# Patient Record
Sex: Male | Born: 1980 | Race: Black or African American | Hispanic: No | Marital: Single | Smoking: Current every day smoker
Health system: Southern US, Community
[De-identification: ages and names within clinical notes are randomized; demographics above are authoritative.]

## PROBLEM LIST (undated history)

## (undated) DIAGNOSIS — E109 Type 1 diabetes mellitus without complications: Secondary | ICD-10-CM

## (undated) DIAGNOSIS — E119 Type 2 diabetes mellitus without complications: Secondary | ICD-10-CM

## (undated) HISTORY — DX: Type 1 diabetes mellitus without complications: E10.9

---

## 2006-01-09 ENCOUNTER — Emergency Department (HOSPITAL_COMMUNITY): Admission: EM | Admit: 2006-01-09 | Discharge: 2006-01-09 | Payer: Self-pay | Admitting: *Deleted

## 2008-07-01 DIAGNOSIS — E109 Type 1 diabetes mellitus without complications: Secondary | ICD-10-CM

## 2008-07-01 HISTORY — DX: Type 1 diabetes mellitus without complications: E10.9

## 2011-04-20 ENCOUNTER — Emergency Department (HOSPITAL_COMMUNITY)
Admission: EM | Admit: 2011-04-20 | Discharge: 2011-04-20 | Discharge status: Against Medical Advice | Payer: Self-pay | Attending: Emergency Medicine | Admitting: Emergency Medicine

## 2011-04-20 DIAGNOSIS — Z0389 Encounter for observation for other suspected diseases and conditions ruled out: Secondary | ICD-10-CM | POA: Insufficient documentation

## 2011-04-21 ENCOUNTER — Inpatient Hospital Stay (HOSPITAL_COMMUNITY)
Admission: EM | Admit: 2011-04-21 | Discharge: 2011-04-23 | DRG: 639 | Disposition: A | Discharge status: Home / Self Care | Payer: Self-pay | Attending: Internal Medicine | Admitting: Internal Medicine

## 2011-04-21 DIAGNOSIS — Z833 Family history of diabetes mellitus: Secondary | ICD-10-CM

## 2011-04-21 DIAGNOSIS — E1010 Type 1 diabetes mellitus with ketoacidosis without coma: Principal | ICD-10-CM | POA: Diagnosis present

## 2011-04-21 DIAGNOSIS — Z794 Long term (current) use of insulin: Secondary | ICD-10-CM

## 2011-04-21 DIAGNOSIS — F17200 Nicotine dependence, unspecified, uncomplicated: Secondary | ICD-10-CM | POA: Diagnosis present

## 2011-04-21 LAB — CBC
HCT: 48.2 % (ref 39.0–52.0)
Hemoglobin: 16.5 g/dL (ref 13.0–17.0)
MCH: 27.1 pg (ref 26.0–34.0)
MCHC: 34.2 g/dL (ref 30.0–36.0)
MCV: 79.1 fL (ref 78.0–100.0)
RBC: 6.09 MIL/uL — ABNORMAL HIGH (ref 4.22–5.81)

## 2011-04-21 LAB — BASIC METABOLIC PANEL
BUN: 27 mg/dL — ABNORMAL HIGH (ref 6–23)
CO2: 7 mEq/L — CL (ref 19–32)
Chloride: 93 mEq/L — ABNORMAL LOW (ref 96–112)
Creatinine, Ser: 1.59 mg/dL — ABNORMAL HIGH (ref 0.50–1.35)

## 2011-04-21 LAB — DIFFERENTIAL
Basophils Absolute: 0 10*3/uL (ref 0.0–0.1)
Lymphs Abs: 1.5 10*3/uL (ref 0.7–4.0)
Monocytes Relative: 4 % (ref 3–12)
Neutro Abs: 16.4 10*3/uL — ABNORMAL HIGH (ref 1.7–7.7)
Neutrophils Relative %: 88 % — ABNORMAL HIGH (ref 43–77)

## 2011-04-21 LAB — POCT I-STAT, CHEM 8
Calcium, Ion: 1.14 mmol/L (ref 1.12–1.32)
Creatinine, Ser: 1.9 mg/dL — ABNORMAL HIGH (ref 0.50–1.35)
Glucose, Bld: 562 mg/dL (ref 70–99)
Hemoglobin: 19.4 g/dL — ABNORMAL HIGH (ref 13.0–17.0)
Potassium: 6.4 mEq/L (ref 3.5–5.1)
Sodium: 132 mEq/L — ABNORMAL LOW (ref 135–145)
TCO2: 10 mmol/L (ref 0–100)

## 2011-04-21 LAB — POCT I-STAT 3, VENOUS BLOOD GAS (G3P V)
Acid-base deficit: 21 mmol/L — ABNORMAL HIGH (ref 0.0–2.0)
Patient temperature: 98.6
pCO2, Ven: 34.5 mmHg — ABNORMAL LOW (ref 45.0–50.0)
pO2, Ven: 30 mmHg (ref 30.0–45.0)

## 2011-04-21 LAB — URINALYSIS, ROUTINE W REFLEX MICROSCOPIC
Protein, ur: 30 mg/dL — AB
pH: 5 (ref 5.0–8.0)

## 2011-04-21 LAB — GLUCOSE, CAPILLARY
Glucose-Capillary: 488 mg/dL — ABNORMAL HIGH (ref 70–99)
Glucose-Capillary: 377 mg/dL — ABNORMAL HIGH (ref 70–99)

## 2011-04-21 LAB — POTASSIUM: Potassium: 6.3 mEq/L (ref 3.5–5.1)

## 2011-04-21 LAB — URINE MICROSCOPIC-ADD ON

## 2011-04-22 LAB — GLUCOSE, CAPILLARY
Glucose-Capillary: 297 mg/dL — ABNORMAL HIGH (ref 70–99)
Glucose-Capillary: 321 mg/dL — ABNORMAL HIGH (ref 70–99)
Glucose-Capillary: 185 mg/dL — ABNORMAL HIGH (ref 70–99)
Glucose-Capillary: 230 mg/dL — ABNORMAL HIGH (ref 70–99)
Glucose-Capillary: 121 mg/dL — ABNORMAL HIGH (ref 70–99)
Glucose-Capillary: 132 mg/dL — ABNORMAL HIGH (ref 70–99)
Glucose-Capillary: 526 mg/dL — ABNORMAL HIGH (ref 70–99)
Glucose-Capillary: 320 mg/dL — ABNORMAL HIGH (ref 70–99)

## 2011-04-22 LAB — CBC
HCT: 38.3 % — ABNORMAL LOW (ref 39.0–52.0)
Hemoglobin: 13.1 g/dL (ref 13.0–17.0)
MCH: 25.9 pg — ABNORMAL LOW (ref 26.0–34.0)
MCHC: 34.2 g/dL (ref 30.0–36.0)
MCV: 75.8 fL — ABNORMAL LOW (ref 78.0–100.0)
Platelets: 350 10*3/uL (ref 150–400)
RBC: 5.05 MIL/uL (ref 4.22–5.81)
RDW: 14.8 % (ref 11.5–15.5)
WBC: 15 10*3/uL — ABNORMAL HIGH (ref 4.0–10.5)

## 2011-04-22 LAB — MAGNESIUM: Magnesium: 2.3 mg/dL (ref 1.5–2.5)

## 2011-04-22 LAB — BASIC METABOLIC PANEL
BUN: 20 mg/dL (ref 6–23)
BUN: 16 mg/dL (ref 6–23)
CO2: 19 mEq/L (ref 19–32)
Calcium: 8.1 mg/dL — ABNORMAL LOW (ref 8.4–10.5)
Calcium: 8.2 mg/dL — ABNORMAL LOW (ref 8.4–10.5)
Chloride: 110 mEq/L (ref 96–112)
Creatinine, Ser: 1.33 mg/dL (ref 0.50–1.35)
Creatinine, Ser: 1.24 mg/dL (ref 0.50–1.35)
GFR calc Af Amer: 82 mL/min — ABNORMAL LOW (ref >90)
GFR calc Af Amer: 89 mL/min — ABNORMAL LOW (ref >90)
GFR calc non Af Amer: 77 mL/min — ABNORMAL LOW (ref >90)
Glucose, Bld: 126 mg/dL — ABNORMAL HIGH (ref 70–99)
Potassium: 3.9 mEq/L (ref 3.5–5.1)
Sodium: 138 mEq/L (ref 135–145)

## 2011-04-22 LAB — PHOSPHORUS: Phosphorus: 2.1 mg/dL — ABNORMAL LOW (ref 2.3–4.6)

## 2011-04-22 LAB — URINE CULTURE: Colony Count: 5000

## 2011-04-23 LAB — GLUCOSE, CAPILLARY: Glucose-Capillary: 257 mg/dL — ABNORMAL HIGH (ref 70–99)

## 2011-04-23 LAB — BASIC METABOLIC PANEL
BUN: 13 mg/dL (ref 6–23)
Creatinine, Ser: 1.11 mg/dL (ref 0.50–1.35)
GFR calc Af Amer: >90 mL/min (ref >90)
GFR calc non Af Amer: 88 mL/min — ABNORMAL LOW (ref >90)

## 2011-04-23 NOTE — H&P (Signed)
Matthew [name], Matthew [name] NO.:  [id number]  MEDICAL RECORD NO.:  [id number]  LOCATION:  3312                         FACILITY:  [facility]  PHYSICIAN:  Matthew [name], MDDATE OF BIRTH:  [birth year]  DATE OF ADMISSION:  04/21/2011 DATE OF DISCHARGE:                             HISTORY & PHYSICAL   PRIMARY CARE PHYSICIAN:  Dr. Darius [name] in [place], but the patient is moving to [place].  CHIEF COMPLAINT:  Feels bad allover.  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  He did have a little nausea, but only vomited one time.  He has had DKA before, the last episode was about 8 months ago, so he recognizes the symptoms.  The patient said his last dose of insulin was slightly greater than 48 hours ago.  Yesterday, his sugars were quite high and today he said he started feeling really horrible, so he came and for treatment.  PAST MEDICAL HISTORY:  Significant for diabetes mellitus type 1 which was diagnosed in 2009.  The patient was around 30 years old at that time.  SOCIAL HISTORY:  He does smoke cigarettes.  Occasional marijuana use. Occasional drinker.  FAMILY HISTORY:  He has an aunt who has type 1 diabetes.  The patient has no history of any surgeries.  MEDICATION LIST PRIOR TO ARRIVAL:  70/30 insulin 30 units every morning and sliding scale insulin that he would take with meals 3 times a day.  REVIEW OF SYSTEMS:  The patient was in his usual state of health while he was on his insulin a couple of days ago without any trouble with lower extremity edema, headache, chest pain, shortness of breath.  No fevers, no chills, no diarrhea, no constipation, no abdominal pain at that time.  All other systems are reviewed and are negative prior to him feeling sick in the last 24 hours.  PHYSICAL EXAMINATION:  VITAL SIGNS:  In the emergency  department, initially blood pressure was 144/88.  Initially, they were unable to get a temperature.  Pulse 118, respiratory rate 22, O2 sats 100%. GENERAL:  The patient is a well-nourished, well-developed young man in no acute distress. HEENT:  Normocephalic and atraumatic.  Pupils are equal and round. Sclerae are nonicteric.  Oral mucosa is moist. NECK:  Supple.  No lymphadenopathy.  No thyromegaly.  No jugular venous distention.  The patient has been able to drink some liquids. CARDIAC:  Tachycardic, but regular. LUNGS:  Clear to auscultation bilaterally.  No wheezes, rhonchi, or rales. ABDOMEN:  He has some slight guarding, but denies tenderness.  No rebound, or guarding, no hepatosplenomegaly.  He does have bowel sounds, though they are infrequent. EXTREMITIES:  No evidence of clubbing, cyanosis, or pitting edema.  He does have palpable DP pulses bilaterally. SKIN:  Warm, dry, and intact.  No open lesions or rashes.  He does have tattoos. NEUROLOGICAL:  He is alert and oriented x3.  His cranial nerves II-XII are intact grossly.  He has a normal affect.  He has 5/5 strength in his upper and lower extremities.  Sensory examination is deferred because of acidosis. MUSCULOSKELETAL:  No evidence of effusion of effusions of his joints. Fairly good range of motion.  DATA:  The patient's i-STAT revealed a hemoglobin of 19, hematocrit of 57, sodium is 132, potassium 6.4, chloride 105, glucose 562, BUN 33, creatinine 1.9.  He had an ABG which revealed pH of 7.03, it was venous. His other blood work is pending.  He did have a urinalysis which revealed clear urine greater than 1000 glucose, ketones, negative nitrite, negative leukocyte esterase, rare squamous epithelial cells. His had no radiology studies.  ASSESSMENT AND PLAN:  A 30 year old gentleman in diabetic ketoacidosis at least moderate in nature.  The plan is to put him on IV fluids.  He is already on an IV insulin drip and has  already started to receive boluses of normal saline.  We can correct his acidosis and resolve the DKA, but the patient's longstanding problem is his inability to afford medical care.  He ran out of insulin because he was not able to buy it and had not been able to get to the doctor to get a new prescription, so we will ask case management to see him.  He is planning to move to [facility] from [place] perhaps away of the find him a primary care physician, but even without that my recommendation for now is to put him on the least expensive insulin which I believe is NPH.  We will have case management verify this, though it is inconvenient he should be able to afford it and will at least have access to insulin at all time, so we will start him on 10 units of NPH twice daily once he is off the insulin drip.  He previously was on 30 units of 70/30 insulin once daily and then had a sliding scale that he would take with his meals, so we will start with the NPH as I believe it is a only couple dollars a month and await further recommendations or better recommendations from case management.     Matthew Bottoms, MD     CVC/MEDQ  D:  04/21/2011  T:  04/22/2011  Job:  [id number]  cc:   Dr. Darius [name]  Electronically Signed by Matthew Irish MD on 04/23/2011 12:16:24 PM

## 2011-04-29 NOTE — Discharge Summary (Signed)
  NAMEHUMBERTO, [name] NO.:  [id number]  MEDICAL RECORD NO.:  [id number]  LOCATION:  5019                         FACILITY:  [facility]  PHYSICIAN:  Lonia Blood, M.D.       DATE OF BIRTH:  1981/04/23  DATE OF ADMISSION:  04/22/2011 DATE OF DISCHARGE:  04/23/2011                              DISCHARGE SUMMARY   PRIMARY CARE PHYSICIAN:  This patient has been referred to the [facility] for ongoing diabetes management.  DISCHARGE DIAGNOSES: 1. Diabetic ketoacidosis due to inability to afford the insulin. 2. Diabetes mellitus type 1. 3. Hypokalemia. 4. Tobacco abuse.  DISCHARGE MEDICATIONS: 1. Insulin NovoLog FlexPen 8 units with each meal. 2. Lantus 25 units daily.  CONDITION AT DISCHARGE:  Matthew [name] was discharged in good condition. Able to tolerate the regular diet without nausea or vomiting.  He was scheduled for [facility] on May 02, 2011.  PROCEDURES DURING THIS ADMISSION:  No procedures done.  CONSULTATION:  No consultations obtained.  HISTORY AND PHYSICAL:  Refer to the dictated H and P which was done by Dr. Gasper Sells.  HOSPITAL COURSE:  Matthew [name] is a 30 year old gentleman who was recently released from prison and has no money, presented to the emergency room with complaints of nausea, vomiting, feeling sick.  He had absolutely no way of buying his insulin as he has no cash.  His glucose was found at 562, pH was 7.0, and creatinine 1.9.  Initial chem-8 revealed a potassium of 6.4 and a bicarbonate of 10.  Repeat bicarbonate was confirmed to be very low and potassium was more than 7.5.  A repeat potassium in the same day was 6.3.  Matthew [name] was started on intravenous insulin and intravenous bicarbonate.  He was monitored on telemetry.  He remained stable overnight and by April 22, 2011, his diabetic ketoacidosis corrected.  His hyperkalemia resolved.  He was able to tolerate the regular diet and he was  switched over to subcutaneous insulin.  The patient's measured of hemoglobin A1c was 8.9.  Matthew [name] has very few financial resources , and he is a type 1 diabetic.   We felt that he will benefit from the [facility] diabetes treatment program.     Lonia Blood, M.D.     SL/MEDQ  D:  04/27/2011  T:  04/27/2011  Job:  [id number]  Electronically Signed by Lonia Blood M.D. on 04/29/2011 09:29:03 PM

## 2011-05-03 ENCOUNTER — Encounter: Payer: Self-pay | Admitting: Internal Medicine

## 2011-05-03 ENCOUNTER — Ambulatory Visit (INDEPENDENT_AMBULATORY_CARE_PROVIDER_SITE_OTHER): Payer: Self-pay | Admitting: Internal Medicine

## 2011-05-03 VITALS — BP 135/89 | HR 90 | Temp 98.0°F | Ht 67.0 in | Wt 191.7 lb

## 2011-05-03 DIAGNOSIS — E109 Type 1 diabetes mellitus without complications: Secondary | ICD-10-CM | POA: Insufficient documentation

## 2011-05-03 DIAGNOSIS — B351 Tinea unguium: Secondary | ICD-10-CM

## 2011-05-03 DIAGNOSIS — Z598 Other problems related to housing and economic circumstances: Secondary | ICD-10-CM

## 2011-05-03 MED ORDER — CICLOPIROX OLAMINE POWD: Status: DC

## 2011-05-03 MED ORDER — INSULIN ASPART PROT & ASPART (70-30 MIX) 100 UNIT/ML ~~LOC~~ SUSP: 26.0000 [IU] | Freq: Two times a day (BID) | SUBCUTANEOUS | Status: DC

## 2011-05-03 NOTE — Progress Notes (Signed)
  Subjective:    Patient ID: Matthew [name], male    DOB: 1981-04-26, 30 y.o.   MRN: [id number]  HPI Patient is a 30 year old man with pmh significant for Type 1 DM (last A1C 8.9 - 04/2011) who presents to the clinic for hospital follow-up. Patient was recently admitted for DKA because he ran out of insulin. He was given enough Lantus to last him until his appt today. He reports he has been taking his lantus as directed, along with sliding scale insulin 8 units prior to meals. He denies lightheadedness, sob, dizziness, abdominal pain, nausea or vomiting. Patient states he does not check his blood sugars regularly, and that his meter broke after sitting on it.  He also reports getting over an upper respiratory infection, and that he only has a cough now. No fevers, chills, or sore throat.    Review of Systems  Respiratory: Positive for cough. Negative for apnea, chest tightness, shortness of breath and wheezing.   Cardiovascular: Negative for chest pain and palpitations.  Gastrointestinal: Negative for nausea, vomiting, abdominal pain, diarrhea and constipation.       Objective:   Physical Exam  Constitutional: He is oriented to person, place, and time. He appears well-developed.  HENT:  Head: Normocephalic.  Eyes: Pupils are equal, round, and reactive to light.  Neck: Normal range of motion.  Cardiovascular: Normal rate and regular rhythm.   Pulmonary/Chest: Effort normal and breath sounds normal. He has no wheezes. He has no rales.  Abdominal: Soft. Bowel sounds are normal.  Musculoskeletal: Normal range of motion.       Fungal toenails  Neurological: He is alert and oriented to person, place, and time.  Psychiatric: He has a normal mood and affect.          Assessment & Plan:

## 2011-05-03 NOTE — Assessment & Plan Note (Addendum)
Lab Results  Component Value Date   HGBA1C 8.9* 04/23/2011   CREATININE 1.11 04/23/2011    Last eye exam and foot exam: No results found for this basename: HMDIABEYEEXA, HMDIABFOOTEX  Foot exam done 05/03/2011, within normal limits  Assessment: Diabetes control: not controlled Progress toward goals: deteriorated Barriers to meeting goals: financial need  Plan: Diabetes treatment: Change Lantus to 70/30 and start patient on 26 units BID, and continue SSI Refer to: none Instruction/counseling given: reminded to bring blood glucose meter & log to each visit, reminded to bring medications to each visit and discussed foot care  No ACE-I at this time. Will check microalb-cr ratio at next visit, as his urine sample was accidentally thrown out during today's visit.

## 2011-05-03 NOTE — Assessment & Plan Note (Signed)
No lesions or ulcers present. Thickened toenails consistent with onychomyocosis. Will prescribed patient anti-fungal ointment.

## 2011-05-03 NOTE — Patient Instructions (Signed)
Please follow up in 1 month. Please start taking 70/30 Insulin, 26 units twice daily, along with sliding scale insulin.

## 2011-05-03 NOTE — Assessment & Plan Note (Signed)
Has applied for medicaid, awaiting approval.

## 2013-01-07 ENCOUNTER — Other Ambulatory Visit: Payer: Self-pay

## 2013-10-18 ENCOUNTER — Encounter (HOSPITAL_COMMUNITY): Payer: Self-pay | Admitting: Emergency Medicine

## 2013-10-18 ENCOUNTER — Emergency Department (HOSPITAL_COMMUNITY)
Admission: EM | Admit: 2013-10-18 | Discharge: 2013-10-19 | Disposition: A | Discharge status: Home / Self Care | Payer: Self-pay | Attending: Emergency Medicine | Admitting: Emergency Medicine

## 2013-10-18 DIAGNOSIS — R5381 Other malaise: Secondary | ICD-10-CM | POA: Insufficient documentation

## 2013-10-18 DIAGNOSIS — R112 Nausea with vomiting, unspecified: Secondary | ICD-10-CM | POA: Insufficient documentation

## 2013-10-18 DIAGNOSIS — Z794 Long term (current) use of insulin: Secondary | ICD-10-CM | POA: Insufficient documentation

## 2013-10-18 DIAGNOSIS — R1084 Generalized abdominal pain: Secondary | ICD-10-CM | POA: Insufficient documentation

## 2013-10-18 DIAGNOSIS — E109 Type 1 diabetes mellitus without complications: Secondary | ICD-10-CM | POA: Insufficient documentation

## 2013-10-18 DIAGNOSIS — F17200 Nicotine dependence, unspecified, uncomplicated: Secondary | ICD-10-CM | POA: Insufficient documentation

## 2013-10-18 DIAGNOSIS — R739 Hyperglycemia, unspecified: Secondary | ICD-10-CM

## 2013-10-18 DIAGNOSIS — R5383 Other fatigue: Secondary | ICD-10-CM

## 2013-10-18 LAB — URINALYSIS, ROUTINE W REFLEX MICROSCOPIC
Bilirubin Urine: NEGATIVE
Glucose, UA: >1000 mg/dL — AB
Hgb urine dipstick: NEGATIVE
Ketones, ur: NEGATIVE mg/dL
LEUKOCYTES UA: NEGATIVE
NITRITE: NEGATIVE
PROTEIN: NEGATIVE mg/dL
Specific Gravity, Urine: 1.046 — ABNORMAL HIGH (ref 1.005–1.030)
Urobilinogen, UA: 0.2 mg/dL (ref 0.0–1.0)
pH: 6 (ref 5.0–8.0)

## 2013-10-18 LAB — COMPREHENSIVE METABOLIC PANEL
ALT: 15 U/L (ref 0–53)
AST: 18 U/L (ref 0–37)
Albumin: 3.5 g/dL (ref 3.5–5.2)
Alkaline Phosphatase: 69 U/L (ref 39–117)
BILIRUBIN TOTAL: 0.5 mg/dL (ref 0.3–1.2)
BUN: 13 mg/dL (ref 6–23)
CHLORIDE: 97 meq/L (ref 96–112)
CO2: 23 mEq/L (ref 19–32)
CREATININE: 1.22 mg/dL (ref 0.50–1.35)
Calcium: 9.7 mg/dL (ref 8.4–10.5)
GFR calc non Af Amer: 77 mL/min — ABNORMAL LOW (ref >90)
GFR, EST AFRICAN AMERICAN: 89 mL/min — AB (ref >90)
GLUCOSE: 428 mg/dL — AB (ref 70–99)
POTASSIUM: 4.4 meq/L (ref 3.7–5.3)
Sodium: 138 mEq/L (ref 137–147)
TOTAL PROTEIN: 7.3 g/dL (ref 6.0–8.3)

## 2013-10-18 LAB — CBC
HCT: 45.3 % (ref 39.0–52.0)
HEMOGLOBIN: 16.1 g/dL (ref 13.0–17.0)
MCH: 27.5 pg (ref 26.0–34.0)
MCHC: 35.5 g/dL (ref 30.0–36.0)
MCV: 77.3 fL — ABNORMAL LOW (ref 78.0–100.0)
Platelets: 293 10*3/uL (ref 150–400)
RBC: 5.86 MIL/uL — ABNORMAL HIGH (ref 4.22–5.81)
RDW: 16.3 % — AB (ref 11.5–15.5)
WBC: 9.9 10*3/uL (ref 4.0–10.5)

## 2013-10-18 LAB — I-STAT VENOUS BLOOD GAS, ED
Bicarbonate: 26 mEq/L — ABNORMAL HIGH (ref 20.0–24.0)
O2 Saturation: 88 %
TCO2: 27 mmol/L (ref 0–100)
pCO2, Ven: 45 mmHg (ref 45.0–50.0)
pH, Ven: 7.37 — ABNORMAL HIGH (ref 7.250–7.300)
pO2, Ven: 57 mmHg — ABNORMAL HIGH (ref 30.0–45.0)

## 2013-10-18 LAB — URINE MICROSCOPIC-ADD ON

## 2013-10-18 LAB — I-STAT TROPONIN, ED: TROPONIN I, POC: 0 ng/mL (ref 0.00–0.08)

## 2013-10-18 LAB — CBG MONITORING, ED: Glucose-Capillary: 282 mg/dL — ABNORMAL HIGH (ref 70–99)

## 2013-10-18 MED ORDER — INSULIN ASPART 100 UNIT/ML IV SOLN: 8.0000 [IU] | Freq: Once | INTRAVENOUS | Status: DC

## 2013-10-18 MED ORDER — INSULIN ASPART 100 UNIT/ML IV SOLN: 10.0000 [IU] | Freq: Once | INTRAVENOUS | Status: DC

## 2013-10-18 MED ORDER — ONDANSETRON HCL 4 MG/2ML IJ SOLN
4.0000 mg | Freq: Once | INTRAMUSCULAR | Status: AC
  Administered 2013-10-19: 4 mg via INTRAVENOUS
  Filled 2013-10-18: qty 2

## 2013-10-18 MED ORDER — INSULIN ASPART 100 UNIT/ML ~~LOC~~ SOLN
8.0000 [IU] | Freq: Once | SUBCUTANEOUS | Status: AC
  Administered 2013-10-18: 8 [IU] via INTRAVENOUS
  Filled 2013-10-18: qty 1

## 2013-10-18 NOTE — ED Notes (Signed)
Per Pt: Pt reports he is a type one diabetic. States he has been without his Insulin for 5 days. Pt reports he thinks he is in DKA. Pt has altered LOC. Pt barely able to stay awake to answer questions. NAD at this time.

## 2013-10-18 NOTE — ED Notes (Signed)
CBG reads 364 at triage.

## 2013-10-18 NOTE — ED Notes (Signed)
Pt in treatment room using cell phone.

## 2013-10-18 NOTE — ED Notes (Signed)
EKG hand delivered to Dr. Gwendolyn GrantWalden.

## 2013-10-18 NOTE — ED Notes (Signed)
Pt reports she is supposed to take 20-25 units of insulin twice a day. Dr. Gwendolyn GrantWalden at bedside.

## 2013-10-19 LAB — CBG MONITORING, ED: Glucose-Capillary: 74 mg/dL (ref 70–99)

## 2013-10-19 NOTE — ED Notes (Signed)
Pt A&OX4, ambulatory with slow steady gait, verbalizing no complaints at this time.  

## 2013-10-19 NOTE — Discharge Instructions (Signed)
Hyperglycemia °Hyperglycemia occurs when the glucose (sugar) in your blood is too high. Hyperglycemia can happen for many reasons, but it most often happens to people who do not know they have diabetes or are not managing their diabetes properly.  °CAUSES  °Whether you have diabetes or not, there are other causes of hyperglycemia. Hyperglycemia can occur when you have diabetes, but it can also occur in other situations that you might not be as aware of, such as: °Diabetes °· If you have diabetes and are having problems controlling your blood glucose, hyperglycemia could occur because of some of the following reasons: °· Not following your meal plan. °· Not taking your diabetes medications or not taking it properly. °· Exercising less or doing less activity than you normally do. °· Being sick. °Pre-diabetes °· This cannot be ignored. Before people develop Type 2 diabetes, they almost always have "pre-diabetes." This is when your blood glucose levels are higher than normal, but not yet high enough to be diagnosed as diabetes. Research has shown that some long-term damage to the body, especially the heart and circulatory system, may already be occurring during pre-diabetes. If you take action to manage your blood glucose when you have pre-diabetes, you may delay or prevent Type 2 diabetes from developing. °Stress °· If you have diabetes, you may be "diet" controlled or on oral medications or insulin to control your diabetes. However, you may find that your blood glucose is higher than usual in the hospital whether you have diabetes or not. This is often referred to as "stress hyperglycemia." Stress can elevate your blood glucose. This happens because of hormones put out by the body during times of stress. If stress has been the cause of your high blood glucose, it can be followed regularly by your caregiver. That way he/she can make sure your hyperglycemia does not continue to get worse or progress to  diabetes. °Steroids °· Steroids are medications that act on the infection fighting system (immune system) to block inflammation or infection. One side effect can be a rise in blood glucose. Most people can produce enough extra insulin to allow for this rise, but for those who cannot, steroids make blood glucose levels go even higher. It is not unusual for steroid treatments to "uncover" diabetes that is developing. It is not always possible to determine if the hyperglycemia will go away after the steroids are stopped. A special blood test called an A1c is sometimes done to determine if your blood glucose was elevated before the steroids were started. °SYMPTOMS °· Thirsty. °· Frequent urination. °· Dry mouth. °· Blurred vision. °· Tired or fatigue. °· Weakness. °· Sleepy. °· Tingling in feet or leg. °DIAGNOSIS  °Diagnosis is made by monitoring blood glucose in one or all of the following ways: °· A1c test. This is a chemical found in your blood. °· Fingerstick blood glucose monitoring. °· Laboratory results. °TREATMENT  °First, knowing the cause of the hyperglycemia is important before the hyperglycemia can be treated. Treatment may include, but is not be limited to: °· Education. °· Change or adjustment in medications. °· Change or adjustment in meal plan. °· Treatment for an illness, infection, etc. °· More frequent blood glucose monitoring. °· Change in exercise plan. °· Decreasing or stopping steroids. °· Lifestyle changes. °HOME CARE INSTRUCTIONS  °· Test your blood glucose as directed. °· Exercise regularly. Your caregiver will give you instructions about exercise. Pre-diabetes or diabetes which comes on with stress is helped by exercising. °· Eat wholesome,   balanced meals. Eat often and at regular, fixed times. Your caregiver or nutritionist will give you a meal plan to guide your sugar intake. °· Being at an ideal weight is important. If needed, losing as little as 10 to 15 pounds may help improve blood  glucose levels. °SEEK MEDICAL CARE IF:  °· You have questions about medicine, activity, or diet. °· You continue to have symptoms (problems such as increased thirst, urination, or weight gain). °SEEK IMMEDIATE MEDICAL CARE IF:  °· You are vomiting or have diarrhea. °· Your breath smells fruity. °· You are breathing faster or slower. °· You are very sleepy or incoherent. °· You have numbness, tingling, or pain in your feet or hands. °· You have chest pain. °· Your symptoms get worse even though you have been following your caregiver's orders. °· If you have any other questions or concerns. °Document Released: 12/11/2000 Document Revised: 09/09/2011 Document Reviewed: 10/14/2011 °ExitCare® Patient Information ©2014 [employer]. ° °Emergency Department Resource Guide °1) Find a Doctor and Pay Out of Pocket °Although you won't have to find out who is covered by your insurance plan, it is a good idea to ask around and get recommendations. You will then need to call the office and see if the doctor you have chosen will accept you as a new patient and what types of options they offer for patients who are self-pay. Some doctors offer discounts or will set up payment plans for their patients who do not have insurance, but you will need to ask so you aren't surprised when you get to your appointment. ° °2) Contact Your Local Health Department °Not all health departments have doctors that can see patients for sick visits, but many do, so it is worth a call to see if yours does. If you don't know where your local health department is, you can check in your phone book. The CDC also has a tool to help you locate your state's health department, and many state websites also have listings of all of their local health departments. ° °3) Find a Walk-in Clinic °If your illness is not likely to be very severe or complicated, you may want to try a walk in clinic. These are popping up all over the country in pharmacies, drugstores, and  shopping centers. They're usually staffed by nurse practitioners or physician assistants that have been trained to treat common illnesses and complaints. They're usually fairly quick and inexpensive. However, if you have serious medical issues or chronic medical problems, these are probably not your best option. ° °No Primary Care Doctor: °- Call [facility] at  832-8000 - they can help you locate a primary care doctor that  accepts your insurance, provides certain services, etc. °- Physician Referral Service- 1-800-533-3463 ° °Chronic Pain Problems: °Organization         Address  Phone   Notes  °[facility]  (336) 297-2271 Patients need to be referred by their primary care doctor.  ° °Medication Assistance: °Organization         Address  Phone   Notes  °[place] [facility] [address] °[place], [place] [address] (336) 641-8030 --Must be a resident of [place] °-- Must have NO insurance coverage whatsoever (no Medicaid/ Medicare, etc.) °-- The pt. MUST have a primary care doctor that directs their care regularly and follows them in the community °  °[facility]  (866) 331-1348   °[employer]  (888) 892-1162   ° °  Agencies that provide inexpensive medical care: °Organization         Address  Phone   Notes  °[facility]  (336) 832-8035   °[facility]    (336) 832-7272   °[facility] [address] °[place], [place] [address] (336) 832-4777   °[facility] [address], °[place] (336) 271-4999   °[facility]    (336) 373-0678   °[facility]    (336) 272-1050   °[facility] ° [address], [place] Phone:  (336) 832-4444, Fax:  (336) 832-4440 Hours of Operation:  9 am - 6 pm, M-F.  Also accepts Medicaid/Medicare and self-pay.  °[facility] ° [address], [place] Phone: (336) 832-3150,  Fax: (336) 832-3151. Hours of Operation:  8:30 am - 5:30 pm, M-F.  Also accepts Medicaid and self-pay.  °[facility] [place] [address], [place] Phone: (336) 878-6027   °[facility] [address], [place], [place] (336)723-1848, Ext. 123 Mondays & Thursdays: 7-9 AM.  First 15 patients are seen on a first come, first serve basis. °  ° °Medicaid-accepting [place] Providers: ° °Organization         Address  Phone   Notes  °[facility] [address], [place] (336) 641-2100 Also accepts self-pay patients.  °[facility] [address], [place] ° (336) 856-9996   °[facility] [address], [place] (336) 288-8857   °[facility] [address] [place] [address], [place] (336) 299-7000   °Veita Bland [address], [place]  ° (336) 373-1557 Only accepts [place] Access Medicaid patients after they have their name applied to their card.  ° °Self-Pay (no insurance) in [place]: ° °Organization         Address  Phone   Notes  °Sickle Cell Patients, [facility] [address], [place] (336) 832-1970   °[facility] [address], [place] (336) 832-4400   °[facility] [place] ° [address], [place] (336) 992-4800   °[facility]/Dr. Osei-Bonsu ° [address] [place] [address], [place] or [address], [place] (336) 841-8500 Phone number for both [place] and [place] locations is the same.  °[facility] [address], [place] (336) 299-0000   °[facility] [address] [place] [address], [place] or [address] (336) 852-7530 °(336) 878-2260   °[facility] [address], [place] (336) 350-1642, phone; (336) 294-5005, fax Sees patients 1st and 3rd Saturday of every month.  Must not qualify for public or private  insurance (i.e. Medicaid, Medicare, [place] Health Choice, Veterans' Benefits) • Household income should be no more than 200% of the poverty level •The clinic cannot treat you if you are pregnant or think you are pregnant • Sexually transmitted diseases are not treated at the clinic.  ° ° °Dental Care: °Organization         Address  Phone  Notes  °[place] [facility] [address], [place] (336) 641-6152 Accepts children up to age 21 who are enrolled in Medicaid or [place] Health Choice; pregnant women with a Medicaid card; and children who have applied for Medicaid or   [place] Health Choice, but were declined, whose parents can pay a reduced fee at time of service.  °[facility] [place]  [address], [place] (336) 641-7733 Accepts children up to age 21 who are enrolled in Medicaid or [place] Health Choice; pregnant women with a Medicaid card; and children who have applied for Medicaid or [place] Health Choice, but were declined, whose parents can pay a reduced fee at time of service.  °[facility] ° [address], [place] (336) 641-4533 Patients are seen by appointment only. Walk-ins are not accepted. [facility] will see patients 18 years of age and older. °Monday - Tuesday (8am-5pm) °Most Wednesdays (8:30-5pm) °$30 per visit, cash only  °[facility] ° [address], [place] (336) 641-4533 Patients are seen by appointment only. Walk-ins are not accepted. [facility] will see patients 18 years of age and older. °One Wednesday Evening (Monthly: Volunteer Based).  $30 per visit, cash only  °[facility]  (919) 537-3737 for adults; Children under age 4, call Graduate Pediatric Dentistry at (919) [phone]. Children aged 4-14, please call (919) [phone] to request a pediatric application. ° Dental services are provided in all areas of dental care  including fillings, crowns and bridges, complete and partial dentures, implants, gum treatment, root canals, and extractions. Preventive care is also provided. Treatment is provided to both adults and children. °Patients are selected via a lottery and there is often a waiting list. °  °[facility] [address], °[place] ° (336) 763-8833 [email or web address] °  °[facility] [address], [place], [place] (336)723-1848, Ext. 123 Second and Fourth Thursday of each month, opens at 6:30 AM; Clinic ends at 9 AM.  Patients are seen on a first-come first-served basis, and a limited number are seen during each clinic.  ° °[facility] ° [address], [place], [place] (336) 723-7904   Eligibility Requirements °You must have lived in [place], [place], or [place] counties for at least the last three months. °  You cannot be eligible for state or federal sponsored healthcare insurance, including Veterans Administration, Medicaid, or Medicare. °  You generally cannot be eligible for healthcare insurance through your employer.  °  How to apply: °Eligibility screenings are held every Tuesday and Wednesday afternoon from 1:00 pm until 4:00 pm. You do not need an appointment for the interview!  °[facility] [address], [place], [place] 336-631-2330   °[facility]  336-342-8273   °[facility]  336-703-3100   °[facility]  336-570-6415   ° °Behavioral Health Resources in the Community: °Intensive Outpatient Programs °Organization         Address  Phone  Notes  °[facility] [address], [place], [place] 336-878-6098   °[facility] [address], [place], [place] 336-832-9800   °[facility] [address], [place], [place] ° 336-882-2125   °[facility] [address],  °[place], [place] 1-800-853-5163 or 336-641-4981    °Substance Abuse Resources °Organization         Address  Phone  Notes  °[facility]  336-882-2125   °[facility]  336-784-9470   °[facility]  336-285-9073   °[facility]  336-845-3988   °Residential & Outpatient Substance Abuse Program  1-800-659-3381   °  Psychological Services °Organization         Address  Phone  Notes  °[facility]  336- 832-9600   °[facility]  336- 378-7881   °[facility] [address], [place] 1-800-853-5163 or 336-641-4981   ° °Mobile Crisis Teams °Organization         Address  Phone  Notes  °[facility], Mobile Crisis Care Unit  1-877-626-1772   °Assertive °[facility] ° [address] [place], [place] 336-834-9664   °Sharon DeEsch [address] °[place] 336-554-5454   ° °Self-Help/Support Groups °Organization         Address  Phone             Notes  °[facility] - variety of support groups  336- 373-1402 Call for more information  °Narcotics Anonymous (NA), [facility] [address], °[place]  2 meetings at this location  ° °Residential Treatment Programs °Organization         Address  Phone  Notes  °[facility] [address],    °[place]  1-866-801-8205   °[facility] ° [address], [place], [place] 704-293-8524   °[facility] [address], [place] 336-845-3988 Admissions: 8am-3pm M-F  °[facility] [address]    °[place], [place] 336-841-1104   °[facility] [address], [place], [place] 336-379-7146   °[facility] [address]  °[place], [place] 336-285-9073   °[facility] Intensive Outpatient [address], [place], [place] 336-852-3033   °[facility] [address]  °[place], [place] 1-877-615-2722 or 336-784-9470   °[facility] [address] [place], [place] 336-227-7417 Accepts Medicaid  °[facility] [address]  ° [place] 1-800-659-3381 Substance Abuse/Addiction Treatment  ° °[place] Behavioral Health Resources °Organization         Address  Phone  Notes  °[facility]  (888) 581-9988   °Julie Brannon, PhD [address] [place], [place]   (336) 349-5553 or (336) 951-0000   °[facility]   [address] °[place], [place] (336) 349-4454   °[facility] [address], [place], [place] (336) 342-8316 Insurance/Medicaid/sponsorship through [facility]  °[facility] [address]                                    [place], [place] (336) 342-8316 Therapy/tele-psych/case  °[facility] [address]  ° [place], [place] (336) 349-2233    °Dr. Arfeen  (336) 349-4544   °[facility] [place]  [employer] [place] [facility] 1) [address], [place] °2) [address], [place] °3)  [address], [place] (336) 349-3220 °(336) 342-7768 ° °(336) 342-8140   °[place] [facility] (336) 342-1394 or (336) 342-3537 (After Hours)    ° ° °

## 2013-10-19 NOTE — ED Provider Notes (Signed)
CSN: [id number]     Arrival date & time 10/18/13  2111 History   First MD Initiated Contact with Patient 10/18/13 2130     Chief Complaint  Patient presents with  . Hyperglycemia     (Consider location/radiation/quality/duration/timing/severity/associated sxs/prior Treatment) Patient is a 33 y.o. male presenting with hyperglycemia. The history is provided by the patient.  Hyperglycemia Blood sugar level PTA:  >400 Severity:  Moderate Onset quality:  Gradual Timing:  Constant Progression:  Worsening Chronicity:  Recurrent Diabetes status:  Controlled with insulin Current diabetic therapy:  Out of 70/30 insulin, using regular insulin before meals Context: change in medication (out of 70/30)   Relieved by:  Nothing Ineffective treatments:  None tried Associated symptoms: abdominal pain, nausea and vomiting   Associated symptoms: no fever and no shortness of breath     Past Medical History  Diagnosis Date  . DM type 1 (diabetes mellitus, type 1) 2010    [facility], started on insulin   History reviewed. No pertinent past surgical history. Family History  Problem Relation Age of Onset  . Hypertension Father    History  Substance Use Topics  . Smoking status: Current Every Day Smoker -- 1.00 packs/day for 15 years    Types: Cigarettes  . Smokeless tobacco: Not on file  . Alcohol Use: Yes     Comment: occasionally - beer     Review of Systems  Constitutional: Negative for fever.  Respiratory: Negative for cough and shortness of breath.   Gastrointestinal: Positive for nausea, vomiting and abdominal pain. Negative for diarrhea.  All other systems reviewed and are negative.     Allergies  Ibuprofen  Home Medications   Prior to Admission medications   Medication Sig Start Date End Date Taking? Authorizing Provider  insulin NPH-regular Human (NOVOLIN 70/30) (70-30) 100 UNIT/ML injection Inject into the skin. 20 units every morning and  21 units at bedtime.   Yes Historical Provider, MD  insulin aspart protamine- aspart (NOVOLOG MIX 70/30) (70-30) 100 UNIT/ML injection Inject 20-25 Units into the skin 2 (two) times daily with a meal. 20 units every morning and 21 units at night 05/03/11 05/02/12  Amanjot Sidhu, MD   BP 168/80  Pulse 108  Temp(Src) 98 F (36.7 C) (Oral)  Resp 20  SpO2 97% Physical Exam  Nursing note and vitals reviewed. Constitutional: He is oriented to person, place, and time. He appears well-developed and well-nourished. No distress.  HENT:  Head: Normocephalic and atraumatic.  Mouth/Throat: No oropharyngeal exudate.  Eyes: EOM are normal. Pupils are equal, round, and reactive to light.  Neck: Normal range of motion. Neck supple.  Cardiovascular: Normal rate and regular rhythm.  Exam reveals no friction rub.   No murmur heard. Pulmonary/Chest: Effort normal and breath sounds normal. No respiratory distress. He has no wheezes. He has no rales.  Abdominal: Soft. He exhibits no distension. There is tenderness (mild, diffuse). There is no rebound.  Musculoskeletal: Normal range of motion. He exhibits no edema.  Neurological: He is alert and oriented to person, place, and time. No cranial nerve deficit. He exhibits normal muscle tone.  Skin: No rash noted. He is not diaphoretic.    ED Course  Procedures (including critical care time) Labs Review Labs Reviewed  CBC - Abnormal; Notable for the following:    RBC 5.86 (*)    MCV 77.3 (*)    RDW 16.3 (*)    All other components within normal limits  COMPREHENSIVE METABOLIC PANEL - Abnormal; Notable for the following:    Glucose, Bld 428 (*)    GFR calc non Af Amer 77 (*)    GFR calc Af Amer 89 (*)    All other components within normal limits  URINALYSIS, ROUTINE W REFLEX MICROSCOPIC - Abnormal; Notable for the following:    Specific Gravity, Urine 1.046 (*)    Glucose, UA >1000 (*)    All other components within normal limits  CBG MONITORING, ED -  Abnormal; Notable for the following:    Glucose-Capillary 282 (*)    All other components within normal limits  I-STAT VENOUS BLOOD GAS, ED - Abnormal; Notable for the following:    pH, Ven 7.370 (*)    pO2, Ven 57.0 (*)    Bicarbonate 26.0 (*)    All other components within normal limits  URINE MICROSCOPIC-ADD ON  I-STAT TROPOININ, ED    Imaging Review No results found.   EKG Interpretation None      Date: 10/19/2013  Rate: 83  Rhythm: normal sinus rhythm  QRS Axis: normal  Intervals: normal  ST/T Wave abnormalities: ST elevation inferiorly, V2-V6 - c/w early repolarization  Conduction Disutrbances:none  Narrative Interpretation:   Old EKG Reviewed: none available   MDM   Final diagnoses:  None    89-year-old insulin-dependent diabetic presents with nausea, vomiting, malaise. Concern is in DKA. In the thousands on for 5 days. He's a 70/30 insulin at home. In triage noted to have altered level of consciousness, however here alert and oriented, acting normal here. Vitals stable. On exam, mild belly pain, no guarding or rebound, no concern for surgical abdomen. Labs show normal white count, normal CO2, hyperglycemia in the mid 400s. 8 units of IV insulin given. Sugar trended down in the 280s. After fluids, patient is much better. He is stable for discharge. Has regular insulin at home that he can use until he gets his 70/30 insulin in a few days    Dagmar HaitWilliam Bradden Tadros, MD 10/19/13 0004

## 2014-04-02 ENCOUNTER — Emergency Department (HOSPITAL_COMMUNITY): Payer: Self-pay

## 2014-04-02 ENCOUNTER — Encounter (HOSPITAL_COMMUNITY): Payer: Self-pay | Admitting: Emergency Medicine

## 2014-04-02 ENCOUNTER — Inpatient Hospital Stay (HOSPITAL_COMMUNITY)
Admission: EM | Admit: 2014-04-02 | Discharge: 2014-04-06 | DRG: 637 | Disposition: A | Discharge status: Home / Self Care | Payer: Self-pay | Attending: Pulmonary Disease | Admitting: Pulmonary Disease

## 2014-04-02 DIAGNOSIS — J9600 Acute respiratory failure, unspecified whether with hypoxia or hypercapnia: Secondary | ICD-10-CM | POA: Diagnosis present

## 2014-04-02 DIAGNOSIS — Z720 Tobacco use: Secondary | ICD-10-CM | POA: Diagnosis present

## 2014-04-02 DIAGNOSIS — E1011 Type 1 diabetes mellitus with ketoacidosis with coma: Secondary | ICD-10-CM

## 2014-04-02 DIAGNOSIS — Z8249 Family history of ischemic heart disease and other diseases of the circulatory system: Secondary | ICD-10-CM

## 2014-04-02 DIAGNOSIS — G9341 Metabolic encephalopathy: Secondary | ICD-10-CM | POA: Diagnosis present

## 2014-04-02 DIAGNOSIS — J9601 Acute respiratory failure with hypoxia: Secondary | ICD-10-CM

## 2014-04-02 DIAGNOSIS — G928 Other toxic encephalopathy: Secondary | ICD-10-CM | POA: Diagnosis present

## 2014-04-02 DIAGNOSIS — E1010 Type 1 diabetes mellitus with ketoacidosis without coma: Principal | ICD-10-CM | POA: Diagnosis present

## 2014-04-02 DIAGNOSIS — R000 Tachycardia, unspecified: Secondary | ICD-10-CM | POA: Diagnosis present

## 2014-04-02 DIAGNOSIS — F14129 Cocaine abuse with intoxication, unspecified: Secondary | ICD-10-CM | POA: Diagnosis present

## 2014-04-02 DIAGNOSIS — F1410 Cocaine abuse, uncomplicated: Secondary | ICD-10-CM | POA: Diagnosis present

## 2014-04-02 DIAGNOSIS — R451 Restlessness and agitation: Secondary | ICD-10-CM | POA: Diagnosis present

## 2014-04-02 DIAGNOSIS — E1110 Type 2 diabetes mellitus with ketoacidosis without coma: Secondary | ICD-10-CM | POA: Diagnosis present

## 2014-04-02 DIAGNOSIS — E875 Hyperkalemia: Secondary | ICD-10-CM | POA: Diagnosis present

## 2014-04-02 DIAGNOSIS — F17210 Nicotine dependence, cigarettes, uncomplicated: Secondary | ICD-10-CM | POA: Diagnosis present

## 2014-04-02 DIAGNOSIS — D72829 Elevated white blood cell count, unspecified: Secondary | ICD-10-CM | POA: Diagnosis present

## 2014-04-02 DIAGNOSIS — Z794 Long term (current) use of insulin: Secondary | ICD-10-CM

## 2014-04-02 DIAGNOSIS — E1310 Other specified diabetes mellitus with ketoacidosis without coma: Secondary | ICD-10-CM

## 2014-04-02 DIAGNOSIS — E861 Hypovolemia: Secondary | ICD-10-CM | POA: Diagnosis present

## 2014-04-02 DIAGNOSIS — G92 Toxic encephalopathy: Secondary | ICD-10-CM | POA: Diagnosis present

## 2014-04-02 DIAGNOSIS — N179 Acute kidney failure, unspecified: Secondary | ICD-10-CM | POA: Diagnosis present

## 2014-04-02 DIAGNOSIS — F1590 Other stimulant use, unspecified, uncomplicated: Secondary | ICD-10-CM | POA: Diagnosis present

## 2014-04-02 DIAGNOSIS — E874 Mixed disorder of acid-base balance: Secondary | ICD-10-CM | POA: Diagnosis present

## 2014-04-02 HISTORY — DX: Type 2 diabetes mellitus without complications: E11.9

## 2014-04-02 LAB — URINALYSIS, ROUTINE W REFLEX MICROSCOPIC
Bilirubin Urine: NEGATIVE
Glucose, UA: >1000 mg/dL — AB
LEUKOCYTES UA: NEGATIVE
NITRITE: NEGATIVE
PH: 5 (ref 5.0–8.0)
Protein, ur: 30 mg/dL — AB
Specific Gravity, Urine: 1.02 (ref 1.005–1.030)
Urobilinogen, UA: 0.2 mg/dL (ref 0.0–1.0)

## 2014-04-02 LAB — I-STAT ARTERIAL BLOOD GAS, ED
ACID-BASE DEFICIT: 25 mmol/L — AB (ref 0.0–2.0)
Acid-base deficit: 25 mmol/L — ABNORMAL HIGH (ref 0.0–2.0)
Acid-base deficit: 23 mmol/L — ABNORMAL HIGH (ref 0.0–2.0)
BICARBONATE: 4.7 meq/L — AB (ref 20.0–24.0)
BICARBONATE: 7.2 meq/L — AB (ref 20.0–24.0)
Bicarbonate: 4.7 mEq/L — ABNORMAL LOW (ref 20.0–24.0)
O2 SAT: 76 %
O2 SAT: 77 %
O2 Saturation: 94 %
PCO2 ART: 18.8 mmHg — AB (ref 35.0–45.0)
PCO2 ART: 29.4 mmHg — AB (ref 35.0–45.0)
PH ART: 7.007 — AB (ref 7.350–7.450)
PO2 ART: 45 mmHg — AB (ref 80.0–100.0)
Patient temperature: 98.6
TCO2: 5 mmol/L (ref 0–100)
TCO2: 5 mmol/L (ref 0–100)
TCO2: 8 mmol/L (ref 0–100)
pCO2 arterial: 16 mmHg — CL (ref 35.0–45.0)
pH, Arterial: 7.053 — CL (ref 7.350–7.450)
pH, Arterial: 6.999 — CL (ref 7.350–7.450)
pO2, Arterial: 61 mmHg — ABNORMAL LOW (ref 80.0–100.0)
pO2, Arterial: 106 mmHg — ABNORMAL HIGH (ref 80.0–100.0)

## 2014-04-02 LAB — I-STAT TROPONIN, ED: TROPONIN I, POC: 0.02 ng/mL (ref 0.00–0.08)

## 2014-04-02 LAB — CBC WITH DIFFERENTIAL/PLATELET
BASOS PCT: 0 % (ref 0–1)
Basophils Absolute: 0 10*3/uL (ref 0.0–0.1)
Eosinophils Absolute: 0 10*3/uL (ref 0.0–0.7)
Eosinophils Relative: 0 % (ref 0–5)
HEMATOCRIT: 51.1 % (ref 39.0–52.0)
Hemoglobin: 15.7 g/dL (ref 13.0–17.0)
LYMPHS PCT: 14 % (ref 12–46)
Lymphs Abs: 3.4 10*3/uL (ref 0.7–4.0)
MCH: 27.9 pg (ref 26.0–34.0)
MCHC: 30.7 g/dL (ref 30.0–36.0)
MCV: 90.9 fL (ref 78.0–100.0)
MONOS PCT: 7 % (ref 3–12)
Monocytes Absolute: 1.7 10*3/uL — ABNORMAL HIGH (ref 0.1–1.0)
NEUTROS ABS: 18.9 10*3/uL — AB (ref 1.7–7.7)
Neutrophils Relative %: 79 % — ABNORMAL HIGH (ref 43–77)
Platelets: 369 10*3/uL (ref 150–400)
RBC: 5.62 MIL/uL (ref 4.22–5.81)
RDW: 19.5 % — ABNORMAL HIGH (ref 11.5–15.5)
WBC: 24 10*3/uL — AB (ref 4.0–10.5)

## 2014-04-02 LAB — COMPREHENSIVE METABOLIC PANEL
ALK PHOS: 141 U/L — AB (ref 39–117)
ALT: 23 U/L (ref 0–53)
AST: 47 U/L — ABNORMAL HIGH (ref 0–37)
Albumin: 3.8 g/dL (ref 3.5–5.2)
BUN: 41 mg/dL — AB (ref 6–23)
CALCIUM: 8.9 mg/dL (ref 8.4–10.5)
CO2: <7 mEq/L — CL (ref 19–32)
CREATININE: 2.65 mg/dL — AB (ref 0.50–1.35)
Chloride: 79 mEq/L — ABNORMAL LOW (ref 96–112)
GFR calc non Af Amer: 30 mL/min — ABNORMAL LOW (ref >90)
GFR, EST AFRICAN AMERICAN: 35 mL/min — AB (ref >90)
Glucose, Bld: 1137 mg/dL (ref 70–99)
Sodium: 124 mEq/L — ABNORMAL LOW (ref 137–147)
TOTAL PROTEIN: 7.9 g/dL (ref 6.0–8.3)
Total Bilirubin: 0.3 mg/dL (ref 0.3–1.2)

## 2014-04-02 LAB — PROTIME-INR
INR: 1.34 (ref 0.00–1.49)
Prothrombin Time: 16.6 seconds — ABNORMAL HIGH (ref 11.6–15.2)

## 2014-04-02 LAB — RAPID URINE DRUG SCREEN, HOSP PERFORMED
AMPHETAMINES: NOT DETECTED
Barbiturates: NOT DETECTED
Benzodiazepines: NOT DETECTED
Cocaine: POSITIVE — AB
OPIATES: NOT DETECTED
Tetrahydrocannabinol: NOT DETECTED

## 2014-04-02 LAB — CBG MONITORING, ED
Glucose-Capillary: >600 mg/dL (ref 70–99)
Glucose-Capillary: >600 mg/dL (ref 70–99)
Glucose-Capillary: >600 mg/dL (ref 70–99)
Glucose-Capillary: >600 mg/dL (ref 70–99)

## 2014-04-02 LAB — LIPASE, BLOOD: LIPASE: 12 U/L (ref 11–59)

## 2014-04-02 LAB — URINE MICROSCOPIC-ADD ON

## 2014-04-02 LAB — ETHANOL

## 2014-04-02 LAB — I-STAT CG4 LACTIC ACID, ED
LACTIC ACID, VENOUS: 8.52 mmol/L — AB (ref 0.5–2.2)
LACTIC ACID, VENOUS: 2.11 mmol/L (ref 0.5–2.2)

## 2014-04-02 MED ORDER — VANCOMYCIN HCL IN DEXTROSE 1-5 GM/200ML-% IV SOLN
1000.0000 mg | Freq: Once | INTRAVENOUS | Status: AC
  Administered 2014-04-02: 1000 mg via INTRAVENOUS
  Filled 2014-04-02: qty 200

## 2014-04-02 MED ORDER — HEPARIN SODIUM (PORCINE) 5000 UNIT/ML IJ SOLN
5000.0000 [IU] | Freq: Three times a day (TID) | INTRAMUSCULAR | Status: DC
  Administered 2014-04-03 – 2014-04-05 (×9): 5000 [IU] via SUBCUTANEOUS
  Filled 2014-04-02 (×13): qty 1

## 2014-04-02 MED ORDER — SODIUM CHLORIDE 0.9 % IV SOLN
INTRAVENOUS | Status: DC
  Administered 2014-04-02: 5.4 [IU]/h via INTRAVENOUS
  Administered 2014-04-02: 10.8 [IU]/h via INTRAVENOUS
  Administered 2014-04-02: 16.2 [IU]/h via INTRAVENOUS
  Filled 2014-04-02: qty 2.5

## 2014-04-02 MED ORDER — SODIUM CHLORIDE 0.9 % IV SOLN
1.0000 g | Freq: Once | INTRAVENOUS | Status: AC
  Administered 2014-04-02: 1 g via INTRAVENOUS
  Filled 2014-04-02: qty 10

## 2014-04-02 MED ORDER — HEPARIN SODIUM (PORCINE) 5000 UNIT/ML IJ SOLN
5000.0000 [IU] | Freq: Three times a day (TID) | INTRAMUSCULAR | Status: DC
Start: 2014-04-02 — End: 2014-04-02

## 2014-04-02 MED ORDER — PROPOFOL 10 MG/ML IV EMUL
INTRAVENOUS | Status: AC
  Administered 2014-04-02: 5 ug/kg/min via INTRAVENOUS
  Filled 2014-04-02: qty 100

## 2014-04-02 MED ORDER — DEXTROSE-NACL 5-0.45 % IV SOLN
INTRAVENOUS | Status: DC
  Administered 2014-04-03: 04:00:00 via INTRAVENOUS

## 2014-04-02 MED ORDER — FAMOTIDINE 20 MG PO TABS
20.0000 mg | ORAL_TABLET | Freq: Two times a day (BID) | ORAL | Status: DC
  Filled 2014-04-02: qty 1

## 2014-04-02 MED ORDER — DOCUSATE SODIUM 50 MG/5ML PO LIQD
100.0000 mg | Freq: Two times a day (BID) | ORAL | Status: DC | PRN
  Filled 2014-04-02: qty 10

## 2014-04-02 MED ORDER — LIDOCAINE HCL (CARDIAC) 20 MG/ML IV SOLN
INTRAVENOUS | Status: AC
  Filled 2014-04-02: qty 5

## 2014-04-02 MED ORDER — PROPOFOL 10 MG/ML IV EMUL
5.0000 ug/kg/min | Freq: Once | INTRAVENOUS | Status: AC
  Administered 2014-04-02: 5 ug/kg/min via INTRAVENOUS
  Filled 2014-04-02: qty 100

## 2014-04-02 MED ORDER — LORAZEPAM 2 MG/ML IJ SOLN
1.0000 mg | Freq: Once | INTRAMUSCULAR | Status: AC
  Administered 2014-04-02: 1 mg via INTRAVENOUS
  Filled 2014-04-02: qty 1

## 2014-04-02 MED ORDER — ASPIRIN 81 MG PO CHEW: 324.0000 mg | CHEWABLE_TABLET | ORAL | Status: AC

## 2014-04-02 MED ORDER — SODIUM CHLORIDE 0.9 % IV BOLUS (SEPSIS)
1000.0000 mL | Freq: Once | INTRAVENOUS | Status: AC
  Administered 2014-04-02: 1000 mL via INTRAVENOUS

## 2014-04-02 MED ORDER — SODIUM CHLORIDE 0.9 % IV SOLN: INTRAVENOUS | Status: AC

## 2014-04-02 MED ORDER — DEXTROSE-NACL 5-0.45 % IV SOLN: INTRAVENOUS | Status: DC

## 2014-04-02 MED ORDER — SODIUM CHLORIDE 0.9 % IV BOLUS (SEPSIS): 2000.0000 mL | Freq: Once | INTRAVENOUS | Status: AC

## 2014-04-02 MED ORDER — DEXTROSE 50 % IV SOLN
25.0000 mL | INTRAVENOUS | Status: DC | PRN
Start: 2014-04-02 — End: 2014-04-06

## 2014-04-02 MED ORDER — SODIUM CHLORIDE 0.9 % IV SOLN: Freq: Once | INTRAVENOUS | Status: DC

## 2014-04-02 MED ORDER — PIPERACILLIN-TAZOBACTAM 3.375 G IVPB 30 MIN
3.3750 g | Freq: Once | INTRAVENOUS | Status: AC
  Administered 2014-04-02: 3.375 g via INTRAVENOUS
  Filled 2014-04-02: qty 50

## 2014-04-02 MED ORDER — ONDANSETRON HCL 4 MG/2ML IJ SOLN
4.0000 mg | Freq: Once | INTRAMUSCULAR | Status: AC
  Administered 2014-04-02: 4 mg via INTRAVENOUS
  Filled 2014-04-02: qty 2

## 2014-04-02 MED ORDER — PROPOFOL 10 MG/ML IV EMUL
0.0000 ug/kg/min | INTRAVENOUS | Status: DC
  Administered 2014-04-03 – 2014-04-04 (×7): 50 ug/kg/min via INTRAVENOUS
  Filled 2014-04-02 (×7): qty 100

## 2014-04-02 MED ORDER — FENTANYL CITRATE 0.05 MG/ML IJ SOLN
100.0000 ug | INTRAMUSCULAR | Status: DC | PRN
  Administered 2014-04-03 – 2014-04-04 (×2): 100 ug via INTRAVENOUS
  Filled 2014-04-02 (×3): qty 2

## 2014-04-02 MED ORDER — SODIUM CHLORIDE 0.9 % IV BOLUS (SEPSIS): 1000.0000 mL | Freq: Once | INTRAVENOUS | Status: DC

## 2014-04-02 MED ORDER — SODIUM CHLORIDE 0.9 % IV SOLN: 250.0000 mL | INTRAVENOUS | Status: DC | PRN

## 2014-04-02 MED ORDER — FENTANYL CITRATE 0.05 MG/ML IJ SOLN: 100.0000 ug | INTRAMUSCULAR | Status: DC | PRN

## 2014-04-02 MED ORDER — SODIUM CHLORIDE 0.9 % IV SOLN
INTRAVENOUS | Status: DC
  Administered 2014-04-02: 23:00:00 via INTRAVENOUS

## 2014-04-02 MED ORDER — ETOMIDATE 2 MG/ML IV SOLN
INTRAVENOUS | Status: AC
  Administered 2014-04-02: 20 mg
  Filled 2014-04-02: qty 20

## 2014-04-02 MED ORDER — SODIUM CHLORIDE 0.9 % IV SOLN
INTRAVENOUS | Status: DC
  Filled 2014-04-02: qty 2.5

## 2014-04-02 MED ORDER — ASPIRIN 300 MG RE SUPP
300.0000 mg | RECTAL | Status: AC
  Administered 2014-04-03: 300 mg via RECTAL
  Filled 2014-04-02: qty 1

## 2014-04-02 MED ORDER — SUCCINYLCHOLINE CHLORIDE 20 MG/ML IJ SOLN
INTRAMUSCULAR | Status: AC
  Filled 2014-04-02: qty 1

## 2014-04-02 MED ORDER — ROCURONIUM BROMIDE 50 MG/5ML IV SOLN
INTRAVENOUS | Status: AC
  Administered 2014-04-02: 20 mg
  Filled 2014-04-02: qty 2

## 2014-04-02 MED ORDER — SODIUM CHLORIDE 0.9 % IV SOLN
INTRAVENOUS | Status: DC
  Administered 2014-04-02: 19:00:00 via INTRAVENOUS

## 2014-04-02 NOTE — ED Notes (Addendum)
Airway cart at bedside, pt on heart monitor and non re breather mask.

## 2014-04-02 NOTE — ED Notes (Addendum)
Dr Deretha EmoryZackowski given a copy of Chem 8 results

## 2014-04-02 NOTE — ED Provider Notes (Signed)
CSN: [id number]     Arrival date & time 04/02/14  1819 History   First MD Initiated Contact with Patient 04/02/14 1823     Chief Complaint  Patient presents with  . Hyperglycemia  . Altered Mental Status     (Consider location/radiation/quality/duration/timing/severity/associated sxs/prior Treatment) The history is provided by the EMS personnel. The history is limited by the condition of the patient.  Arrived moaning very limited verbal communication, depressed metal status. Tachycardic hyperventilating Cardiac monitor with peaked T-waves and wide QRS complex. High blood sugar. Was vomitng the night before and may have had too much to drink. Is a diabetic.   Past Medical History  Diagnosis Date  . Diabetes mellitus without complication    History reviewed. No pertinent past surgical history. Family History  Problem Relation Age of Onset  . Hypertension Father    History  Substance Use Topics  . Smoking status: Current Every Day Smoker -- 1.00 packs/day for 15 years    Types: Cigarettes  . Smokeless tobacco: Not on file  . Alcohol Use: Yes     Comment: occassional    Review of Systems  Unable to perform ROS Altered mental status    Allergies  Review of patient's allergies indicates no known allergies.  Home Medications   Prior to Admission medications   Medication Sig Start Date End Date Taking? Authorizing Provider  insulin aspart protamine- aspart (NOVOLOG MIX 70/30) (70-30) 100 UNIT/ML injection Inject 12 Units into the skin 3 (three) times daily after meals.   Yes Historical Provider, MD   BP 156/101  Pulse 95  Temp(Src) 98.6 F (37 C) (Core (Comment))  Resp 16  Ht 5\' 7"  (1.702 m)  Wt 177 lb 0.5 oz (80.3 kg)  BMI 27.72 kg/m2  SpO2 100% Physical Exam  Nursing note and vitals reviewed. Constitutional: He appears distressed.  HENT:  Head: Normocephalic and atraumatic.  Dry mouth  Eyes: Pupils are equal, round, and reactive to light.  Neck: Normal range  of motion.  Cardiovascular: Normal heart sounds.   tachycardiac  Pulmonary/Chest: Breath sounds normal. He has no wheezes. He has no rales.  hyperventilating  Abdominal: Soft. There is no tenderness.  Musculoskeletal: He exhibits no edema.  Neurological:  Moaning unable to follow commands but moving all extremities. Occasionally verbal brief.   Skin: No rash noted.  Cold feeling    ED Course  Procedures (including critical care time) Labs Review Labs Reviewed  COMPREHENSIVE METABOLIC PANEL - Abnormal; Notable for the following:    Sodium 124 (*)    Potassium >7.7 (*)    Chloride 79 (*)    CO2 <7 (*)    Glucose, Bld 1137 (*)    BUN 41 (*)    Creatinine, Ser 2.65 (*)    AST 47 (*)    Alkaline Phosphatase 141 (*)    GFR calc non Af Amer 30 (*)    GFR calc Af Amer 35 (*)    All other components within normal limits  URINALYSIS, ROUTINE W REFLEX MICROSCOPIC - Abnormal; Notable for the following:    Glucose, UA >1000 (*)    Hgb urine dipstick MODERATE (*)    Ketones, ur >80 (*)    Protein, ur 30 (*)    All other components within normal limits  URINE RAPID DRUG SCREEN (HOSP PERFORMED) - Abnormal; Notable for the following:    Cocaine POSITIVE (*)    All other components within normal limits  PROTIME-INR - Abnormal; Notable for the following:  Prothrombin Time 16.6 (*)    All other components within normal limits  CBC WITH DIFFERENTIAL - Abnormal; Notable for the following:    WBC 24.0 (*)    RDW 19.5 (*)    Neutrophils Relative % 79 (*)    Neutro Abs 18.9 (*)    Monocytes Absolute 1.7 (*)    All other components within normal limits  URINE MICROSCOPIC-ADD ON - Abnormal; Notable for the following:    Squamous Epithelial / LPF FEW (*)    Bacteria, UA FEW (*)    Casts HYALINE CASTS (*)    All other components within normal limits  BASIC METABOLIC PANEL - Abnormal; Notable for the following:    CO2 10 (*)    Glucose, Bld 423 (*)    BUN 39 (*)    Creatinine, Ser 2.31  (*)    Calcium 8.1 (*)    GFR calc non Af Amer 35 (*)    GFR calc Af Amer 41 (*)    All other components within normal limits  BASIC METABOLIC PANEL - Abnormal; Notable for the following:    CO2 13 (*)    Glucose, Bld 319 (*)    BUN 38 (*)    Creatinine, Ser 2.29 (*)    Calcium 8.3 (*)    GFR calc non Af Amer 36 (*)    GFR calc Af Amer 41 (*)    Anion gap 21 (*)    All other components within normal limits  BASIC METABOLIC PANEL - Abnormal; Notable for the following:    CO2 14 (*)    Glucose, Bld 189 (*)    BUN 36 (*)    Creatinine, Ser 2.12 (*)    Calcium 8.3 (*)    GFR calc non Af Amer 39 (*)    GFR calc Af Amer 46 (*)    Anion gap 17 (*)    All other components within normal limits  CBC - Abnormal; Notable for the following:    WBC 23.8 (*)    RDW 16.9 (*)    All other components within normal limits  BASIC METABOLIC PANEL - Abnormal; Notable for the following:    Chloride 115 (*)    CO2 17 (*)    Glucose, Bld 111 (*)    BUN 34 (*)    Creatinine, Ser 2.08 (*)    GFR calc non Af Amer 40 (*)    GFR calc Af Amer 47 (*)    All other components within normal limits  BASIC METABOLIC PANEL - Abnormal; Notable for the following:    Chloride 114 (*)    BUN 33 (*)    Creatinine, Ser 2.13 (*)    GFR calc non Af Amer 39 (*)    GFR calc Af Amer 45 (*)    All other components within normal limits  MAGNESIUM - Abnormal; Notable for the following:    Magnesium 3.2 (*)    All other components within normal limits  MAGNESIUM - Abnormal; Notable for the following:    Magnesium 2.8 (*)    All other components within normal limits  MAGNESIUM - Abnormal; Notable for the following:    Magnesium 2.7 (*)    All other components within normal limits  LACTIC ACID, PLASMA - Abnormal; Notable for the following:    Lactic Acid, Venous 2.3 (*)    All other components within normal limits  LACTIC ACID, PLASMA - Abnormal; Notable for the following:    Lactic Acid,  Venous 2.3 (*)    All  other components within normal limits  GLUCOSE, CAPILLARY - Abnormal; Notable for the following:    Glucose-Capillary 435 (*)    All other components within normal limits  GLUCOSE, CAPILLARY - Abnormal; Notable for the following:    Glucose-Capillary 383 (*)    All other components within normal limits  GLUCOSE, CAPILLARY - Abnormal; Notable for the following:    Glucose-Capillary 321 (*)    All other components within normal limits  GLUCOSE, CAPILLARY - Abnormal; Notable for the following:    Glucose-Capillary 255 (*)    All other components within normal limits  GLUCOSE, CAPILLARY - Abnormal; Notable for the following:    Glucose-Capillary 208 (*)    All other components within normal limits  GLUCOSE, CAPILLARY - Abnormal; Notable for the following:    Glucose-Capillary 162 (*)    All other components within normal limits  GLUCOSE, CAPILLARY - Abnormal; Notable for the following:    Glucose-Capillary 112 (*)    All other components within normal limits  BASIC METABOLIC PANEL - Abnormal; Notable for the following:    Chloride 114 (*)    CO2 18 (*)    Glucose, Bld 128 (*)    BUN 31 (*)    Creatinine, Ser 1.91 (*)    GFR calc non Af Amer 45 (*)    GFR calc Af Amer 52 (*)    All other components within normal limits  BASIC METABOLIC PANEL - Abnormal; Notable for the following:    CO2 13 (*)    Glucose, Bld 386 (*)    BUN 29 (*)    Creatinine, Ser 1.85 (*)    GFR calc non Af Amer 46 (*)    GFR calc Af Amer 54 (*)    Anion gap 19 (*)    All other components within normal limits  BASIC METABOLIC PANEL - Abnormal; Notable for the following:    Glucose, Bld 257 (*)    BUN 24 (*)    Creatinine, Ser 1.69 (*)    Calcium 8.3 (*)    GFR calc non Af Amer 52 (*)    GFR calc Af Amer 60 (*)    All other components within normal limits  MAGNESIUM - Abnormal; Notable for the following:    Magnesium 2.6 (*)    All other components within normal limits  PHOSPHORUS - Abnormal; Notable  for the following:    Phosphorus 0.6 (*)    All other components within normal limits  BASIC METABOLIC PANEL - Abnormal; Notable for the following:    Chloride 115 (*)    Glucose, Bld 164 (*)    Creatinine, Ser 1.66 (*)    GFR calc non Af Amer 53 (*)    GFR calc Af Amer 61 (*)    All other components within normal limits  HEMOGLOBIN A1C - Abnormal; Notable for the following:    Hemoglobin A1C 9.9 (*)    Mean Plasma Glucose 237 (*)    All other components within normal limits  GLUCOSE, CAPILLARY - Abnormal; Notable for the following:    Glucose-Capillary 134 (*)    All other components within normal limits  GLUCOSE, CAPILLARY - Abnormal; Notable for the following:    Glucose-Capillary 116 (*)    All other components within normal limits  GLUCOSE, CAPILLARY - Abnormal; Notable for the following:    Glucose-Capillary 114 (*)    All other components within normal limits  GLUCOSE, CAPILLARY -  Abnormal; Notable for the following:    Glucose-Capillary 131 (*)    All other components within normal limits  GLUCOSE, CAPILLARY - Abnormal; Notable for the following:    Glucose-Capillary 241 (*)    All other components within normal limits  CBC - Abnormal; Notable for the following:    WBC 11.3 (*)    RDW 16.9 (*)    All other components within normal limits  BASIC METABOLIC PANEL - Abnormal; Notable for the following:    CO2 18 (*)    Glucose, Bld 165 (*)    Creatinine, Ser 1.60 (*)    Calcium 8.2 (*)    GFR calc non Af Amer 55 (*)    GFR calc Af Amer 64 (*)    All other components within normal limits  GLUCOSE, CAPILLARY - Abnormal; Notable for the following:    Glucose-Capillary 273 (*)    All other components within normal limits  BASIC METABOLIC PANEL - Abnormal; Notable for the following:    CO2 18 (*)    Glucose, Bld 371 (*)    Creatinine, Ser 1.74 (*)    GFR calc non Af Amer 50 (*)    GFR calc Af Amer 58 (*)    All other components within normal limits  GLUCOSE,  CAPILLARY - Abnormal; Notable for the following:    Glucose-Capillary 173 (*)    All other components within normal limits  GLUCOSE, CAPILLARY - Abnormal; Notable for the following:    Glucose-Capillary 120 (*)    All other components within normal limits  GLUCOSE, CAPILLARY - Abnormal; Notable for the following:    Glucose-Capillary 316 (*)    All other components within normal limits  GLUCOSE, CAPILLARY - Abnormal; Notable for the following:    Glucose-Capillary 529 (*)    All other components within normal limits  GLUCOSE, CAPILLARY - Abnormal; Notable for the following:    Glucose-Capillary 261 (*)    All other components within normal limits  GLUCOSE, CAPILLARY - Abnormal; Notable for the following:    Glucose-Capillary 163 (*)    All other components within normal limits  BASIC METABOLIC PANEL - Abnormal; Notable for the following:    Glucose, Bld 150 (*)    GFR calc non Af Amer 74 (*)    GFR calc Af Amer 86 (*)    All other components within normal limits  PHOSPHORUS - Abnormal; Notable for the following:    Phosphorus 2.2 (*)    All other components within normal limits  GLUCOSE, CAPILLARY - Abnormal; Notable for the following:    Glucose-Capillary 174 (*)    All other components within normal limits  GLUCOSE, CAPILLARY - Abnormal; Notable for the following:    Glucose-Capillary 154 (*)    All other components within normal limits  GLUCOSE, CAPILLARY - Abnormal; Notable for the following:    Glucose-Capillary 107 (*)    All other components within normal limits  I-STAT CG4 LACTIC ACID, ED - Abnormal; Notable for the following:    Lactic Acid, Venous 8.52 (*)    All other components within normal limits  CBG MONITORING, ED - Abnormal; Notable for the following:    Glucose-Capillary >600 (*)    All other components within normal limits  CBG MONITORING, ED - Abnormal; Notable for the following:    Glucose-Capillary >600 (*)    All other components within normal limits   I-STAT ARTERIAL BLOOD GAS, ED - Abnormal; Notable for the following:    pH,  Arterial 7.053 (*)    pCO2 arterial 16.0 (*)    pO2, Arterial 45.0 (*)    Bicarbonate 4.7 (*)    Acid-base deficit 25.0 (*)    All other components within normal limits  CBG MONITORING, ED - Abnormal; Notable for the following:    Glucose-Capillary >600 (*)    All other components within normal limits  I-STAT ARTERIAL BLOOD GAS, ED - Abnormal; Notable for the following:    pH, Arterial 7.007 (*)    pCO2 arterial 18.8 (*)    pO2, Arterial 61.0 (*)    Bicarbonate 4.7 (*)    Acid-base deficit 25.0 (*)    All other components within normal limits  CBG MONITORING, ED - Abnormal; Notable for the following:    Glucose-Capillary >600 (*)    All other components within normal limits  I-STAT ARTERIAL BLOOD GAS, ED - Abnormal; Notable for the following:    pH, Arterial 6.999 (*)    pCO2 arterial 29.4 (*)    pO2, Arterial 106.0 (*)    Bicarbonate 7.2 (*)    Acid-base deficit 23.0 (*)    All other components within normal limits  POCT I-STAT 3, VENOUS BLOOD GAS (G3P V) - Abnormal; Notable for the following:    pCO2, Ven 28.6 (*)    pO2, Ven 75.0 (*)    Bicarbonate 13.9 (*)    Acid-base deficit 11.0 (*)    All other components within normal limits  POCT I-STAT 3, VENOUS BLOOD GAS (G3P V) - Abnormal; Notable for the following:    pH, Ven 7.360 (*)    pCO2, Ven 28.6 (*)    Bicarbonate 16.1 (*)    Acid-base deficit 8.0 (*)    All other components within normal limits  POCT I-STAT 3, ART BLOOD GAS (G3+) - Abnormal; Notable for the following:    pH, Arterial 7.321 (*)    pO2, Arterial 190.0 (*)    Acid-base deficit 4.0 (*)    All other components within normal limits  CULTURE, BLOOD (ROUTINE X 2)  CULTURE, BLOOD (ROUTINE X 2)  MRSA PCR SCREENING  LIPASE, BLOOD  ETHANOL  LACTIC ACID, PLASMA  TRIGLYCERIDES  GLUCOSE, CAPILLARY  PHOSPHORUS  GLUCOSE, CAPILLARY  MAGNESIUM  GLUCOSE, CAPILLARY  MAGNESIUM   GLUCOSE, CAPILLARY  I-STAT TROPOININ, ED  I-STAT CHEM 8, ED  I-STAT CG4 LACTIC ACID, ED   Results for orders placed during the hospital encounter of 04/02/14  CULTURE, BLOOD (ROUTINE X 2)      Result Value Ref Range   Specimen Description BLOOD RIGHT ARM     Special Requests BOTTLES DRAWN AEROBIC AND ANAEROBIC 5 CC     Culture  Setup Time       Value: 04/03/2014 00:48     Performed at [facility]   Culture       Value:        BLOOD CULTURE RECEIVED NO GROWTH TO DATE CULTURE WILL BE HELD FOR 5 DAYS BEFORE ISSUING A FINAL NEGATIVE REPORT     Performed at [facility]   Report Status PENDING    CULTURE, BLOOD (ROUTINE X 2)      Result Value Ref Range   Specimen Description BLOOD RIGHT HAND     Special Requests BOTTLES DRAWN AEROBIC AND ANAEROBIC 5 CC     Culture  Setup Time       Value: 04/03/2014 00:48     Performed at [facility]  Value:        BLOOD CULTURE RECEIVED NO GROWTH TO DATE CULTURE WILL BE HELD FOR 5 DAYS BEFORE ISSUING A FINAL NEGATIVE REPORT     Performed at [facility]   Report Status PENDING    MRSA PCR SCREENING      Result Value Ref Range   MRSA by PCR NEGATIVE  NEGATIVE  COMPREHENSIVE METABOLIC PANEL      Result Value Ref Range   Sodium 124 (*) 137 - 147 mEq/L   Potassium >7.7 (*) 3.7 - 5.3 mEq/L   Chloride 79 (*) 96 - 112 mEq/L   CO2 <7 (*) 19 - 32 mEq/L   Glucose, Bld 1137 (*) 70 - 99 mg/dL   BUN 41 (*) 6 - 23 mg/dL   Creatinine, Ser 4.78 (*) 0.50 - 1.35 mg/dL   Calcium 8.9  8.4 - 29.5 mg/dL   Total Protein 7.9  6.0 - 8.3 g/dL   Albumin 3.8  3.5 - 5.2 g/dL   AST 47 (*) 0 - 37 U/L   ALT 23  0 - 53 U/L   Alkaline Phosphatase 141 (*) 39 - 117 U/L   Total Bilirubin 0.3  0.3 - 1.2 mg/dL   GFR calc non Af Amer 30 (*) >90 mL/min   GFR calc Af Amer 35 (*) >90 mL/min  URINALYSIS, ROUTINE W REFLEX MICROSCOPIC      Result Value Ref Range   Color, Urine YELLOW  YELLOW   APPearance CLEAR  CLEAR   Specific  Gravity, Urine 1.020  1.005 - 1.030   pH 5.0  5.0 - 8.0   Glucose, UA >1000 (*) NEGATIVE mg/dL   Hgb urine dipstick MODERATE (*) NEGATIVE   Bilirubin Urine NEGATIVE  NEGATIVE   Ketones, ur >80 (*) NEGATIVE mg/dL   Protein, ur 30 (*) NEGATIVE mg/dL   Urobilinogen, UA 0.2  0.0 - 1.0 mg/dL   Nitrite NEGATIVE  NEGATIVE   Leukocytes, UA NEGATIVE  NEGATIVE  URINE RAPID DRUG SCREEN (HOSP PERFORMED)      Result Value Ref Range   Opiates NONE DETECTED  NONE DETECTED   Cocaine POSITIVE (*) NONE DETECTED   Benzodiazepines NONE DETECTED  NONE DETECTED   Amphetamines NONE DETECTED  NONE DETECTED   Tetrahydrocannabinol NONE DETECTED  NONE DETECTED   Barbiturates NONE DETECTED  NONE DETECTED  PROTIME-INR      Result Value Ref Range   Prothrombin Time 16.6 (*) 11.6 - 15.2 seconds   INR 1.34  0.00 - 1.49  CBC WITH DIFFERENTIAL      Result Value Ref Range   WBC 24.0 (*) 4.0 - 10.5 K/uL   RBC 5.62  4.22 - 5.81 MIL/uL   Hemoglobin 15.7  13.0 - 17.0 g/dL   HCT 62.1  30.8 - 65.7 %   MCV 90.9  78.0 - 100.0 fL   MCH 27.9  26.0 - 34.0 pg   MCHC 30.7  30.0 - 36.0 g/dL   RDW 84.6 (*) 96.2 - 95.2 %   Platelets 369  150 - 400 K/uL   Neutrophils Relative % 79 (*) 43 - 77 %   Lymphocytes Relative 14  12 - 46 %   Monocytes Relative 7  3 - 12 %   Eosinophils Relative 0  0 - 5 %   Basophils Relative 0  0 - 1 %   Neutro Abs 18.9 (*) 1.7 - 7.7 K/uL   Lymphs Abs 3.4  0.7 - 4.0 K/uL   Monocytes Absolute  1.7 (*) 0.1 - 1.0 K/uL   Eosinophils Absolute 0.0  0.0 - 0.7 K/uL   Basophils Absolute 0.0  0.0 - 0.1 K/uL   RBC Morphology POLYCHROMASIA PRESENT     WBC Morphology MILD LEFT SHIFT (1-5% METAS, OCC MYELO, OCC BANDS)    LIPASE, BLOOD      Result Value Ref Range   Lipase 12  11 - 59 U/L  ETHANOL      Result Value Ref Range   Alcohol, Ethyl (B) <11  0 - 11 mg/dL  URINE MICROSCOPIC-ADD ON      Result Value Ref Range   Squamous Epithelial / LPF FEW (*) RARE   WBC, UA 0-2  <3 WBC/hpf   RBC / HPF 3-6  <3  RBC/hpf   Bacteria, UA FEW (*) RARE   Casts HYALINE CASTS (*) NEGATIVE  BASIC METABOLIC PANEL      Result Value Ref Range   Sodium 141  137 - 147 mEq/L   Potassium 4.8  3.7 - 5.3 mEq/L   Chloride 106  96 - 112 mEq/L   CO2 10 (*) 19 - 32 mEq/L   Glucose, Bld 423 (*) 70 - 99 mg/dL   BUN 39 (*) 6 - 23 mg/dL   Creatinine, Ser 1.61 (*) 0.50 - 1.35 mg/dL   Calcium 8.1 (*) 8.4 - 10.5 mg/dL   GFR calc non Af Amer 35 (*) >90 mL/min   GFR calc Af Amer 41 (*) >90 mL/min   Anion gap NOT CALCULATED  5 - 15  BASIC METABOLIC PANEL      Result Value Ref Range   Sodium 143  137 - 147 mEq/L   Potassium 4.7  3.7 - 5.3 mEq/L   Chloride 109  96 - 112 mEq/L   CO2 13 (*) 19 - 32 mEq/L   Glucose, Bld 319 (*) 70 - 99 mg/dL   BUN 38 (*) 6 - 23 mg/dL   Creatinine, Ser 0.96 (*) 0.50 - 1.35 mg/dL   Calcium 8.3 (*) 8.4 - 10.5 mg/dL   GFR calc non Af Amer 36 (*) >90 mL/min   GFR calc Af Amer 41 (*) >90 mL/min   Anion gap 21 (*) 5 - 15  BASIC METABOLIC PANEL      Result Value Ref Range   Sodium 141  137 - 147 mEq/L   Potassium 4.0  3.7 - 5.3 mEq/L   Chloride 110  96 - 112 mEq/L   CO2 14 (*) 19 - 32 mEq/L   Glucose, Bld 189 (*) 70 - 99 mg/dL   BUN 36 (*) 6 - 23 mg/dL   Creatinine, Ser 0.45 (*) 0.50 - 1.35 mg/dL   Calcium 8.3 (*) 8.4 - 10.5 mg/dL   GFR calc non Af Amer 39 (*) >90 mL/min   GFR calc Af Amer 46 (*) >90 mL/min   Anion gap 17 (*) 5 - 15  CBC      Result Value Ref Range   WBC 23.8 (*) 4.0 - 10.5 K/uL   RBC 5.23  4.22 - 5.81 MIL/uL   Hemoglobin 14.8  13.0 - 17.0 g/dL   HCT 40.9  81.1 - 91.4 %   MCV 83.0  78.0 - 100.0 fL   MCH 28.3  26.0 - 34.0 pg   MCHC 34.1  30.0 - 36.0 g/dL   RDW 78.2 (*) 95.6 - 21.3 %   Platelets 325  150 - 400 K/uL  BASIC METABOLIC PANEL      Result  Value Ref Range   Sodium 146  137 - 147 mEq/L   Potassium 4.3  3.7 - 5.3 mEq/L   Chloride 115 (*) 96 - 112 mEq/L   CO2 17 (*) 19 - 32 mEq/L   Glucose, Bld 111 (*) 70 - 99 mg/dL   BUN 34 (*) 6 - 23 mg/dL    Creatinine, Ser 1.61 (*) 0.50 - 1.35 mg/dL   Calcium 8.6  8.4 - 09.6 mg/dL   GFR calc non Af Amer 40 (*) >90 mL/min   GFR calc Af Amer 47 (*) >90 mL/min   Anion gap 14  5 - 15  BASIC METABOLIC PANEL      Result Value Ref Range   Sodium 145  137 - 147 mEq/L   Potassium 4.2  3.7 - 5.3 mEq/L   Chloride 114 (*) 96 - 112 mEq/L   CO2 19  19 - 32 mEq/L   Glucose, Bld 92  70 - 99 mg/dL   BUN 33 (*) 6 - 23 mg/dL   Creatinine, Ser 0.45 (*) 0.50 - 1.35 mg/dL   Calcium 8.5  8.4 - 40.9 mg/dL   GFR calc non Af Amer 39 (*) >90 mL/min   GFR calc Af Amer 45 (*) >90 mL/min   Anion gap 12  5 - 15  MAGNESIUM      Result Value Ref Range   Magnesium 3.2 (*) 1.5 - 2.5 mg/dL  MAGNESIUM      Result Value Ref Range   Magnesium 2.8 (*) 1.5 - 2.5 mg/dL  MAGNESIUM      Result Value Ref Range   Magnesium 2.7 (*) 1.5 - 2.5 mg/dL  LACTIC ACID, PLASMA      Result Value Ref Range   Lactic Acid, Venous 2.1  0.5 - 2.2 mmol/L  LACTIC ACID, PLASMA      Result Value Ref Range   Lactic Acid, Venous 2.3 (*) 0.5 - 2.2 mmol/L  LACTIC ACID, PLASMA      Result Value Ref Range   Lactic Acid, Venous 2.3 (*) 0.5 - 2.2 mmol/L  TRIGLYCERIDES      Result Value Ref Range   Triglycerides 135  <150 mg/dL  GLUCOSE, CAPILLARY      Result Value Ref Range   Glucose-Capillary 435 (*) 70 - 99 mg/dL  GLUCOSE, CAPILLARY      Result Value Ref Range   Glucose-Capillary 383 (*) 70 - 99 mg/dL  GLUCOSE, CAPILLARY      Result Value Ref Range   Glucose-Capillary 321 (*) 70 - 99 mg/dL  GLUCOSE, CAPILLARY      Result Value Ref Range   Glucose-Capillary 255 (*) 70 - 99 mg/dL  GLUCOSE, CAPILLARY      Result Value Ref Range   Glucose-Capillary 208 (*) 70 - 99 mg/dL  GLUCOSE, CAPILLARY      Result Value Ref Range   Glucose-Capillary 162 (*) 70 - 99 mg/dL  GLUCOSE, CAPILLARY      Result Value Ref Range   Glucose-Capillary 112 (*) 70 - 99 mg/dL  GLUCOSE, CAPILLARY      Result Value Ref Range   Glucose-Capillary 98  70 - 99 mg/dL   BASIC METABOLIC PANEL      Result Value Ref Range   Sodium 145  137 - 147 mEq/L   Potassium 4.5  3.7 - 5.3 mEq/L   Chloride 114 (*) 96 - 112 mEq/L   CO2 18 (*) 19 - 32 mEq/L   Glucose, Bld 128 (*) 70 -  99 mg/dL   BUN 31 (*) 6 - 23 mg/dL   Creatinine, Ser 1.61 (*) 0.50 - 1.35 mg/dL   Calcium 8.4  8.4 - 09.6 mg/dL   GFR calc non Af Amer 45 (*) >90 mL/min   GFR calc Af Amer 52 (*) >90 mL/min   Anion gap 13  5 - 15  BASIC METABOLIC PANEL      Result Value Ref Range   Sodium 138  137 - 147 mEq/L   Potassium 5.3  3.7 - 5.3 mEq/L   Chloride 106  96 - 112 mEq/L   CO2 13 (*) 19 - 32 mEq/L   Glucose, Bld 386 (*) 70 - 99 mg/dL   BUN 29 (*) 6 - 23 mg/dL   Creatinine, Ser 0.45 (*) 0.50 - 1.35 mg/dL   Calcium 8.6  8.4 - 40.9 mg/dL   GFR calc non Af Amer 46 (*) >90 mL/min   GFR calc Af Amer 54 (*) >90 mL/min   Anion gap 19 (*) 5 - 15  BASIC METABOLIC PANEL      Result Value Ref Range   Sodium 142  137 - 147 mEq/L   Potassium 4.3  3.7 - 5.3 mEq/L   Chloride 111  96 - 112 mEq/L   CO2 20  19 - 32 mEq/L   Glucose, Bld 257 (*) 70 - 99 mg/dL   BUN 24 (*) 6 - 23 mg/dL   Creatinine, Ser 8.11 (*) 0.50 - 1.35 mg/dL   Calcium 8.3 (*) 8.4 - 10.5 mg/dL   GFR calc non Af Amer 52 (*) >90 mL/min   GFR calc Af Amer 60 (*) >90 mL/min   Anion gap 11  5 - 15  MAGNESIUM      Result Value Ref Range   Magnesium 2.6 (*) 1.5 - 2.5 mg/dL  PHOSPHORUS      Result Value Ref Range   Phosphorus 0.6 (*) 2.3 - 4.6 mg/dL  PHOSPHORUS      Result Value Ref Range   Phosphorus 2.6  2.3 - 4.6 mg/dL  BASIC METABOLIC PANEL      Result Value Ref Range   Sodium 144  137 - 147 mEq/L   Potassium 4.3  3.7 - 5.3 mEq/L   Chloride 115 (*) 96 - 112 mEq/L   CO2 20  19 - 32 mEq/L   Glucose, Bld 164 (*) 70 - 99 mg/dL   BUN 22  6 - 23 mg/dL   Creatinine, Ser 9.14 (*) 0.50 - 1.35 mg/dL   Calcium 8.5  8.4 - 78.2 mg/dL   GFR calc non Af Amer 53 (*) >90 mL/min   GFR calc Af Amer 61 (*) >90 mL/min   Anion gap 9  5 - 15   HEMOGLOBIN A1C      Result Value Ref Range   Hemoglobin A1C 9.9 (*) <5.7 %   Mean Plasma Glucose 237 (*) <117 mg/dL  GLUCOSE, CAPILLARY      Result Value Ref Range   Glucose-Capillary 134 (*) 70 - 99 mg/dL  GLUCOSE, CAPILLARY      Result Value Ref Range   Glucose-Capillary 116 (*) 70 - 99 mg/dL  GLUCOSE, CAPILLARY      Result Value Ref Range   Glucose-Capillary 114 (*) 70 - 99 mg/dL  GLUCOSE, CAPILLARY      Result Value Ref Range   Glucose-Capillary 91  70 - 99 mg/dL  GLUCOSE, CAPILLARY      Result Value Ref Range   Glucose-Capillary  131 (*) 70 - 99 mg/dL  GLUCOSE, CAPILLARY      Result Value Ref Range   Glucose-Capillary 241 (*) 70 - 99 mg/dL  CBC      Result Value Ref Range   WBC 11.3 (*) 4.0 - 10.5 K/uL   RBC 4.92  4.22 - 5.81 MIL/uL   Hemoglobin 13.6  13.0 - 17.0 g/dL   HCT 16.1  09.6 - 04.5 %   MCV 79.3  78.0 - 100.0 fL   MCH 27.6  26.0 - 34.0 pg   MCHC 34.9  30.0 - 36.0 g/dL   RDW 40.9 (*) 81.1 - 91.4 %   Platelets 275  150 - 400 K/uL  BASIC METABOLIC PANEL      Result Value Ref Range   Sodium 142  137 - 147 mEq/L   Potassium 4.5  3.7 - 5.3 mEq/L   Chloride 112  96 - 112 mEq/L   CO2 18 (*) 19 - 32 mEq/L   Glucose, Bld 165 (*) 70 - 99 mg/dL   BUN 20  6 - 23 mg/dL   Creatinine, Ser 7.82 (*) 0.50 - 1.35 mg/dL   Calcium 8.2 (*) 8.4 - 10.5 mg/dL   GFR calc non Af Amer 55 (*) >90 mL/min   GFR calc Af Amer 64 (*) >90 mL/min   Anion gap 12  5 - 15  MAGNESIUM      Result Value Ref Range   Magnesium 2.5  1.5 - 2.5 mg/dL  GLUCOSE, CAPILLARY      Result Value Ref Range   Glucose-Capillary 273 (*) 70 - 99 mg/dL   Comment 1 Notify RN    BASIC METABOLIC PANEL      Result Value Ref Range   Sodium 140  137 - 147 mEq/L   Potassium 5.0  3.7 - 5.3 mEq/L   Chloride 108  96 - 112 mEq/L   CO2 18 (*) 19 - 32 mEq/L   Glucose, Bld 371 (*) 70 - 99 mg/dL   BUN 20  6 - 23 mg/dL   Creatinine, Ser 9.56 (*) 0.50 - 1.35 mg/dL   Calcium 8.6  8.4 - 21.3 mg/dL   GFR calc non Af  Amer 50 (*) >90 mL/min   GFR calc Af Amer 58 (*) >90 mL/min   Anion gap 14  5 - 15  GLUCOSE, CAPILLARY      Result Value Ref Range   Glucose-Capillary 173 (*) 70 - 99 mg/dL   Comment 1 Notify RN    GLUCOSE, CAPILLARY      Result Value Ref Range   Glucose-Capillary 120 (*) 70 - 99 mg/dL  GLUCOSE, CAPILLARY      Result Value Ref Range   Glucose-Capillary 316 (*) 70 - 99 mg/dL  GLUCOSE, CAPILLARY      Result Value Ref Range   Glucose-Capillary 529 (*) 70 - 99 mg/dL   Comment 1 Documented in Chart    GLUCOSE, CAPILLARY      Result Value Ref Range   Glucose-Capillary 261 (*) 70 - 99 mg/dL  GLUCOSE, CAPILLARY      Result Value Ref Range   Glucose-Capillary 163 (*) 70 - 99 mg/dL  GLUCOSE, CAPILLARY      Result Value Ref Range   Glucose-Capillary 77  70 - 99 mg/dL   Comment 1 Notify RN    BASIC METABOLIC PANEL      Result Value Ref Range   Sodium 145  137 - 147 mEq/L   Potassium  3.7  3.7 - 5.3 mEq/L   Chloride 112  96 - 112 mEq/L   CO2 23  19 - 32 mEq/L   Glucose, Bld 150 (*) 70 - 99 mg/dL   BUN 15  6 - 23 mg/dL   Creatinine, Ser 1.61  0.50 - 1.35 mg/dL   Calcium 8.6  8.4 - 09.6 mg/dL   GFR calc non Af Amer 74 (*) >90 mL/min   GFR calc Af Amer 86 (*) >90 mL/min   Anion gap 10  5 - 15  MAGNESIUM      Result Value Ref Range   Magnesium 2.0  1.5 - 2.5 mg/dL  PHOSPHORUS      Result Value Ref Range   Phosphorus 2.2 (*) 2.3 - 4.6 mg/dL  GLUCOSE, CAPILLARY      Result Value Ref Range   Glucose-Capillary 174 (*) 70 - 99 mg/dL   Comment 1 Documented in Chart     Comment 2 Notify RN    GLUCOSE, CAPILLARY      Result Value Ref Range   Glucose-Capillary 154 (*) 70 - 99 mg/dL  GLUCOSE, CAPILLARY      Result Value Ref Range   Glucose-Capillary 79  70 - 99 mg/dL  GLUCOSE, CAPILLARY      Result Value Ref Range   Glucose-Capillary 107 (*) 70 - 99 mg/dL  I-STAT TROPOININ, ED      Result Value Ref Range   Troponin i, poc 0.02  0.00 - 0.08 ng/mL   Comment 3           I-STAT CG4  LACTIC ACID, ED      Result Value Ref Range   Lactic Acid, Venous 8.52 (*) 0.5 - 2.2 mmol/L  CBG MONITORING, ED      Result Value Ref Range   Glucose-Capillary >600 (*) 70 - 99 mg/dL  CBG MONITORING, ED      Result Value Ref Range   Glucose-Capillary >600 (*) 70 - 99 mg/dL  I-STAT ARTERIAL BLOOD GAS, ED      Result Value Ref Range   pH, Arterial 7.053 (*) 7.350 - 7.450   pCO2 arterial 16.0 (*) 35.0 - 45.0 mmHg   pO2, Arterial 45.0 (*) 80.0 - 100.0 mmHg   Bicarbonate 4.7 (*) 20.0 - 24.0 mEq/L   TCO2 5  0 - 100 mmol/L   O2 Saturation 76.0     Acid-base deficit 25.0 (*) 0.0 - 2.0 mmol/L   Patient temperature 91.4 F     Collection site RADIAL, ALLEN'S TEST ACCEPTABLE     Sample type ARTERIAL     Comment MD NOTIFIED, SUGGEST RECOLLECT    CBG MONITORING, ED      Result Value Ref Range   Glucose-Capillary >600 (*) 70 - 99 mg/dL   Comment 1 Documented in Chart    I-STAT ARTERIAL BLOOD GAS, ED      Result Value Ref Range   pH, Arterial 7.007 (*) 7.350 - 7.450   pCO2 arterial 18.8 (*) 35.0 - 45.0 mmHg   pO2, Arterial 61.0 (*) 80.0 - 100.0 mmHg   Bicarbonate 4.7 (*) 20.0 - 24.0 mEq/L   TCO2 5  0 - 100 mmol/L   O2 Saturation 77.0     Acid-base deficit 25.0 (*) 0.0 - 2.0 mmol/L   Collection site RADIAL, ALLEN'S TEST ACCEPTABLE     Sample type ARTERIAL     Comment MD NOTIFIED, SUGGEST RECOLLECT    CBG MONITORING, ED      Result  Value Ref Range   Glucose-Capillary >600 (*) 70 - 99 mg/dL  I-STAT ARTERIAL BLOOD GAS, ED      Result Value Ref Range   pH, Arterial 6.999 (*) 7.350 - 7.450   pCO2 arterial 29.4 (*) 35.0 - 45.0 mmHg   pO2, Arterial 106.0 (*) 80.0 - 100.0 mmHg   Bicarbonate 7.2 (*) 20.0 - 24.0 mEq/L   TCO2 8  0 - 100 mmol/L   O2 Saturation 94.0     Acid-base deficit 23.0 (*) 0.0 - 2.0 mmol/L   Patient temperature 98.6 F     Collection site RADIAL, ALLEN'S TEST ACCEPTABLE     Sample type ARTERIAL     Comment MD NOTIFIED, SUGGEST RECOLLECT    I-STAT CG4 LACTIC ACID, ED       Result Value Ref Range   Lactic Acid, Venous 2.11  0.5 - 2.2 mmol/L  POCT I-STAT 3, VENOUS BLOOD GAS (G3P V)      Result Value Ref Range   pH, Ven 7.300  7.250 - 7.300   pCO2, Ven 28.6 (*) 45.0 - 50.0 mmHg   pO2, Ven 75.0 (*) 30.0 - 45.0 mmHg   Bicarbonate 13.9 (*) 20.0 - 24.0 mEq/L   TCO2 15  0 - 100 mmol/L   O2 Saturation 92.0     Acid-base deficit 11.0 (*) 0.0 - 2.0 mmol/L   Patient temperature 100.5 F     Collection site IV START     Drawn by VENIPUNCTURE     Sample type VENOUS    POCT I-STAT 3, VENOUS BLOOD GAS (G3P V)      Result Value Ref Range   pH, Ven 7.360 (*) 7.250 - 7.300   pCO2, Ven 28.6 (*) 45.0 - 50.0 mmHg   pO2, Ven 34.0  30.0 - 45.0 mmHg   Bicarbonate 16.1 (*) 20.0 - 24.0 mEq/L   TCO2 17  0 - 100 mmol/L   O2 Saturation 63.0     Acid-base deficit 8.0 (*) 0.0 - 2.0 mmol/L   Patient temperature 98.6 F     Collection site IV START     Drawn by VENIPUNCTURE     Sample type VENOUS     Comment NOTIFIED PHYSICIAN    POCT I-STAT 3, ART BLOOD GAS (G3+)      Result Value Ref Range   pH, Arterial 7.321 (*) 7.350 - 7.450   pCO2 arterial 43.3  35.0 - 45.0 mmHg   pO2, Arterial 190.0 (*) 80.0 - 100.0 mmHg   Bicarbonate 22.3  20.0 - 24.0 mEq/L   TCO2 24  0 - 100 mmol/L   O2 Saturation 100.0     Acid-base deficit 4.0 (*) 0.0 - 2.0 mmol/L   Patient temperature 98.9 F     Collection site RADIAL, ALLEN'S TEST ACCEPTABLE     Drawn by Operator     Sample type ARTERIAL     CRITICAL CARE Performed by: Vanetta Mulders Total critical care time: 60 Critical care time was exclusive of separately billable procedures and treating other patients. Critical care was necessary to treat or prevent imminent or life-threatening deterioration. Critical care was time spent personally by me on the following activities: development of treatment plan with patient and/or surrogate as well as nursing, discussions with consultants, evaluation of patient's response to treatment, examination of  patient, obtaining history from patient or surrogate, ordering and performing treatments and interventions, ordering and review of laboratory studies, ordering and review of radiographic studies, pulse oximetry and re-evaluation of patient's  condition. INTUBATION Performed by: Nicoya Friel  Required items: required blood products, implants, devices, and special equipment available Patient identity confirmed: provided demographic data and hospital-assigned identification number Time out: Immediately prior to procedure a "time out" was called to verify the correct patient, procedure, equipment, support staff and site/side marked as required.  Indications: Altered mental status marked metabolic acidosis.   Intubation method: Number 4Glidescope Laryngoscopy   Preoxygenation: 100% oxygen BVM  Sedatives: 20 mg Etomidate Paralytic: 100 mg of Dr. Rocuronium  Tube Size: 7.5 mm cuffed  Post-procedure assessment: chest rise and ETCO2 monitor Breath sounds: equal and absent over the epigastrium Tube secured with: ETT holder Chest x-ray interpreted by radiologist and me.  Chest x-ray findings: Good position endotracheal tube in appropriate position  Patient tolerated the procedure well with no immediate complications.    Imaging Review Dg Chest Port 1 View  04/04/2014   CLINICAL DATA:  33 year old male with shortness of breath and atelectasis described on Prior chest x-ray. The patient is intubated. Evaluate for position of endotracheal tube.  EXAM: PORTABLE CHEST - 1 VIEW  COMPARISON:  Prior chest x-ray 04/03/2014  FINDINGS: The patient remains intubated. The endotracheal tube remains in good position 3.7 cm above the carina. A nasogastric tube is present with the tip overlying the greater curvature of the stomach. Stable cardiac and mediastinal contours. Borderline cardiomegaly is likely exaggerated by low inspiratory volumes. Inspiratory volumes remain low with mild bibasilar atelectasis.  No new infiltrate, pleural effusion or pneumothorax.  IMPRESSION: 1. Persistent low inspiratory volumes with bibasilar subsegmental atelectasis. 2. Stable and satisfactory support apparatus.   Electronically Signed   By: Malachy Moan M.D.   On: 04/04/2014 07:40     EKG Interpretation   Date/Time:  Saturday April 02 2014 19:15:11 EDT Ventricular Rate:  103 PR Interval:  184 QRS Duration: 112 QT Interval:  374 QTC Calculation: 490 R Axis:   99 Text Interpretation:  Sinus tachycardia Inferior infarct, acute (LCx)  Lateral leads are also involved Prolonged QT interval ED PHYSICIAN  INTERPRETATION AVAILABLE IN [facility] Confirmed by TEST, Record  ([id number]) on 04/04/2014 7:10:22 AM      MDM   Final diagnoses:  Diabetic ketoacidosis associated with other specified diabetes mellitus  Hyperkalemia   Patient arrived for altered mental status very high blood sugar. Initial EKG very concerning for hyperkalemia. Peak T waves widened QRS complexes. Nobody arrived with the patient. The patient was given calcium gluconate based on the EKG. Glucose stabilizer was started. Patient received 2 L of fluid. Patient was hypothermic. Warming blanket the placed. With the calcium gluconate QRS complexes and T waves improved on the monitor repeat EKG showed narrowing of that. Patient's initial troponin was negative. Patient's blood sugar in it up coming back over 1100. Patient's potassium initial read was 8. Patient also had septic workup started Zosyn and vancomycin. Likely gas was markedly elevated. However clinically feel this is all a diabetic ketoacidosis picture. Patient's head CT is pending. Chest x-ray was negative. They're to blood gas that was done I think his venous because patient sats about 100% on room air. Clearly hyperventilating appropriately very acidotic however started on BiPAP due to persistent altered mental status. Patient will verbalize some. But remains confused.  Patient  intubated due to persistent depressed mental status and to protect airway. Despite treatment patient still with depressed mental status.   Presumed severe metabolic acidosis from DKA and presentation of marked hperkalemia. There could be a component of alcohol ketoacidosis as well.  In addition patient covered for possible sepsis with broad spectrum antibiotics after cultures. To admitted by critical care.    Vanetta Mulders, MD 04/05/14 [phone]

## 2014-04-02 NOTE — ED Notes (Signed)
Pt girlfriend at bedside, states that pt came home last night at midnight and was actively vomiting all night. When girlfriend woke up she states pt was sleeping so she went to work, when she arrived home from work she states that pt was unresponsive and unable to answer questions so she notified EMS.

## 2014-04-02 NOTE — ED Notes (Signed)
Pt able to tell first name only, Matthew [name]  (EMT) at bedside for safety sitter. Pt trying to climb out of bed, pt stating "please".

## 2014-04-02 NOTE — ED Notes (Addendum)
Dr. Deretha EmoryZackowski at bedside to inform pt girlfriend of plans to intubate, all questions answered. Girlfriend understads.

## 2014-04-02 NOTE — ED Notes (Addendum)
Per EMS: pt found by gf on the floor in apartment, lsw was 10/01. EMS noted pt altered and unable to follow commands, cbg read "high" per ems. Pt girlfriend also reports that pt may have had too much to drink. Pt disoriented, unable to answer any questions. EMS also noted peaked t waves.

## 2014-04-02 NOTE — ED Notes (Signed)
Critical care at bedside  

## 2014-04-02 NOTE — ED Notes (Signed)
Dr. Deretha EmoryZackowski at bedside to update pt girlfriend, Resp tech called to start pt on bipap

## 2014-04-02 NOTE — H&P (Signed)
PULMONARY / CRITICAL CARE MEDICINE HISTORY AND PHYSICAL EXAMINATION   Name: Matthew [name] MRN: [id number] DOB: 1981-02-08    ADMISSION DATE:  04/02/2014  PRIMARY SERVICE: PCCM  CHIEF COMPLAINT:  AMS  BRIEF PATIENT DESCRIPTION: 33 y/o man with DM1, hx of substance abuse p/w w/ severe DKA after missing insulin, drinking, and taking cocaine.  SIGNIFICANT EVENTS / STUDIES:  Severe Acidosis in ED, intubated due to inability to protect airway  LINES / TUBES: 7.5 ETT placed in ED - 10/3 PIVs  CULTURES: Blood cx - 10/3 Urine cx - 10/3  ANTIBIOTICS: Pip-tazo x1 - 10/3 Vanc x1 - 10/3  HISTORY OF PRESENT ILLNESS:   Matthew [name] (Pt's actual name is Matthew [name] - MRN: #[id number]) is a 33 y/o man with a history of DM1 diagnosed about 7 years ago who was brought to the ED by EMS after being found with altered mental status by his girlfriend. She reports that he left on Thursday (without brining his insulin) and apparently was with friends and drinking for much of this time. He returned home 18 hours prior to presentation but she was sleeping when he returned and didn't speak to him. She went to work and when she returned found him lying on the floor and somnolent. He was tachypnic and was not coherent. He was brought to the ED where he was found to be in severe DKA, with lactic acidosis as well as hyperkalemia. He was treated with NS, insulin gtt, calcium gluconate (he did have marked EKG changes). Due to his persistent somnolence and tachypnea, he was intubated. PCCM was consulted for further management.   PAST MEDICAL HISTORY :  Past Medical History  Diagnosis Date  . Diabetes mellitus without complication    History reviewed. No pertinent past surgical history. Prior to Admission medications   Medication Sig Start Date End Date Taking? Authorizing Provider  insulin aspart protamine- aspart (NOVOLOG MIX 70/30) (70-30) 100 UNIT/ML injection Inject 12 Units into the skin 3 (three) times daily  after meals.   Yes Historical Provider, MD  insulin NPH Human (HUMULIN N,NOVOLIN N) 100 UNIT/ML injection Inject 20 Units into the skin 2 (two) times daily.   Yes Historical Provider, MD   No Known Allergies  FAMILY HISTORY:  Family History  Problem Relation Age of Onset  . Hypertension Father    SOCIAL HISTORY:  reports that he has been smoking Cigarettes.  He has a 15 pack-year smoking history. He does not have any smokeless tobacco history on file. He reports that he drinks alcohol. He reports that he uses illicit drugs (Cocaine and Marijuana).  REVIEW OF SYSTEMS:  Cannot assess due to intubation.  SUBJECTIVE:   VITAL SIGNS: Temp:  [90.7 F (32.6 C)-96.4 F (35.8 C)] 96.4 F (35.8 C) (10/03 2148) Pulse Rate:  [100-147] 131 (10/03 2220) Resp:  [20-36] 30 (10/03 2220) BP: (65-162)/(31-93) 136/69 mmHg (10/03 2220) SpO2:  [97 %-100 %] 100 % (10/03 2220) FiO2 (%):  [60 %] 60 % (10/03 2053) Weight:  [170 lb (77.111 kg)] 170 lb (77.111 kg) (10/03 1828) HEMODYNAMICS:   VENTILATOR SETTINGS: Vent Mode:  [-] PRVC FiO2 (%):  [60 %] 60 % Set Rate:  [30 bmp] 30 bmp Vt Set:  [550 mL] 550 mL PEEP:  [5 cmH20] 5 cmH20 Plateau Pressure:  [24 cmH20] 24 cmH20 INTAKE / OUTPUT: Intake/Output     10/03 0701 - 10/04 0700   IV Piggyback 1100   Total Intake(mL/kg) 1100 (14.3)   Urine (mL/kg/hr) 2600  Total Output 2600   Net -1500         PHYSICAL EXAMINATION: General:  Intubated man in NAD Neuro:  Sedated, not responding to noxious stimulus HEENT:  Dry MM Neck: no JVD Cardiovascular:  Tachycardia Lungs:  CTAB Abdomen:  Soft Musculoskeletal:  No deformities Skin:  Mild skin turgor, no rashes  LABS:  CBC  Recent Labs Lab 04/02/14 1826  WBC 24.0*  HGB 15.7  HCT 51.1  PLT 369   Coag's  Recent Labs Lab 04/02/14 1826  INR 1.34   BMET  Recent Labs Lab 04/02/14 1826  NA 124*  K >7.7*  CL 79*  CO2 <7*  BUN 41*  CREATININE 2.65*  GLUCOSE 1137*    Electrolytes  Recent Labs Lab 04/02/14 1826  CALCIUM 8.9   Sepsis Markers  Recent Labs Lab 04/02/14 1857 04/02/14 2203  LATICACIDVEN 8.52* 2.11   ABG  Recent Labs Lab 04/02/14 1945 04/02/14 1952 04/02/14 2144  PHART 7.007* 7.053* 6.999*  PCO2ART 18.8* 16.0* 29.4*  PO2ART 61.0* 45.0* 106.0*   Liver Enzymes  Recent Labs Lab 04/02/14 1826  AST 47*  ALT 23  ALKPHOS 141*  BILITOT 0.3  ALBUMIN 3.8   Cardiac Enzymes No results found for this basename: TROPONINI, PROBNP,  in the last 168 hours Glucose  Recent Labs Lab 04/02/14 1822 04/02/14 1909 04/02/14 2006 04/02/14 2126  GLUCAP >600* >600* >600* >600*    Imaging Ct Head Wo Contrast  04/02/2014   CLINICAL DATA:  Hyperglycemia and altered mental status. Possible intoxication.  EXAM: CT HEAD WITHOUT CONTRAST  TECHNIQUE: Contiguous axial images were obtained from the base of the skull through the vertex without intravenous contrast.  COMPARISON:  None.  FINDINGS: Ventricles, cisterns and other CSF spaces are within normal. There is no mass, mass effect, shift of midline structures or acute hemorrhage. There is no evidence to suggest acute infarction. Bones and soft tissues are unremarkable.  IMPRESSION: No acute intracranial findings.   Electronically Signed   By: Elberta Fortisaniel  Boyle M.D.   On: 04/02/2014 20:46   Dg Chest Portable 1 View  04/02/2014   CLINICAL DATA:  Altered mental status.  Hyperglycemia.  EXAM: PORTABLE CHEST - 1 VIEW  COMPARISON:  04/02/2014  FINDINGS: Interval placement of endotracheal tube with tip measuring 2.3 cm above the carina. Shallow inspiration. Atelectasis in the lung bases. No focal airspace consolidation. The heart size and mediastinal contours are within normal limits. The visualized skeletal structures are unremarkable.  IMPRESSION: Endotracheal tube tip measures 2.3 cm above the carina. Shallow inspiration with atelectasis in the lung bases.   Electronically Signed   By: Burman NievesWilliam  Stevens  M.D.   On: 04/02/2014 21:11   Dg Chest Port 1 View  04/02/2014   CLINICAL DATA:  Hyperglycemia and altered mental status.  EXAM: PORTABLE CHEST - 1 VIEW  COMPARISON:  None.  FINDINGS: The heart size and mediastinal contours are within normal limits. Both lungs are clear. The visualized skeletal structures are unremarkable.  IMPRESSION: No active disease.   Electronically Signed   By: Elberta Fortisaniel  Boyle M.D.   On: 04/02/2014 19:15    EKG: Marked peaked T-waves on initial study with widening of QRS; after volume and Ca, more normalization of EKG CXR: Normal CXR.  ASSESSMENT / PLAN:  Active Problems:   DKA (diabetic ketoacidoses)   PULMONARY A: Severe metabolic acidosis with respiratory acidosis  P:   - PRVC with high MV (Vt of 500, RR 30) - Serial ABGs  CARDIOVASCULAR A:  EKG changes due to metabolic disarray (Hyper-K) Tachycardia due to hypovolemia P:   - Treating Hyper K (likely total-body hypo-K as is often seen in DKA) - Aggressive volume ressuscitation in hypovolemic state  RENAL A: Acute Kidney Injury P:   Aggressive volume resuscitation. Will trend renal function.  GASTROINTESTINAL A: No acute issues P:   NPO for now  HEMATOLOGIC A: Leukocytosis P:   Likely due to DKA -- possible infection. Will trend / continue to monitor.  INFECTIOUS A: No clear infectious trigger on u/a, CXR P:   S/p Vanc + Zosyn as well as cultures. Will hold off on further ABX for now.  ENDOCRINE A: Severe DKA P:   On DKA protocol, aggressive volume resuscitation. Likely trigger is lack of insulin + EtOH (per report) + cocaine use. Serial chemistries to ensure resolution of metabolic disarray. Lactic acidosis improving, likely from alcoholic ketoacidosis or from extreme cellular injury due to metabolic starved state.  NEUROLOGIC A: AMS due to metabolic encephalopathy  P:   Will hold sedation in AM and see if pt responds.  BEST PRACTICE / DISPOSITION Level of Care:  ICU Primary  Service:  PCCM Consultants:  None Code Status:  Full Diet:  NPO DVT Px:  Heparin GI Px:  Famotidine Skin Integrity:  Intact Social / Family:  Updated at bedside on admisison  TODAY'S SUMMARY: 33 y/o w/ DM1, severe DKA due to lack of insulin, EtOH, cocaine  I have personally obtained a history, examined the patient, evaluated laboratory and imaging results, formulated the assessment and plan and placed orders.  CRITICAL CARE: The patient is critically ill with multiple organ systems failure and requires high complexity decision making for assessment and support, frequent evaluation and titration of therapies, application of advanced monitoring technologies and extensive interpretation of multiple databases. Critical Care Time devoted to patient care services described in this note is 90 minutes.   Jamie Kato, MD Pulmonary & Critical Care Medicine April 03, 2014, 12:04 AM   04/02/2014, 11:54 PM

## 2014-04-03 ENCOUNTER — Inpatient Hospital Stay (HOSPITAL_COMMUNITY): Payer: Self-pay

## 2014-04-03 DIAGNOSIS — E1310 Other specified diabetes mellitus with ketoacidosis without coma: Secondary | ICD-10-CM

## 2014-04-03 DIAGNOSIS — J9601 Acute respiratory failure with hypoxia: Secondary | ICD-10-CM

## 2014-04-03 LAB — CBC
HCT: 43.4 % (ref 39.0–52.0)
Hemoglobin: 14.8 g/dL (ref 13.0–17.0)
MCH: 28.3 pg (ref 26.0–34.0)
MCHC: 34.1 g/dL (ref 30.0–36.0)
MCV: 83 fL (ref 78.0–100.0)
PLATELETS: 325 10*3/uL (ref 150–400)
RBC: 5.23 MIL/uL (ref 4.22–5.81)
RDW: 16.9 % — AB (ref 11.5–15.5)
WBC: 23.8 10*3/uL — ABNORMAL HIGH (ref 4.0–10.5)

## 2014-04-03 LAB — BASIC METABOLIC PANEL
ANION GAP: 19 — AB (ref 5–15)
ANION GAP: 11 (ref 5–15)
Anion gap: 21 — ABNORMAL HIGH (ref 5–15)
Anion gap: 17 — ABNORMAL HIGH (ref 5–15)
Anion gap: 14 (ref 5–15)
Anion gap: 12 (ref 5–15)
Anion gap: 13 (ref 5–15)
BUN: 39 mg/dL — ABNORMAL HIGH (ref 6–23)
BUN: 31 mg/dL — ABNORMAL HIGH (ref 6–23)
BUN: 29 mg/dL — ABNORMAL HIGH (ref 6–23)
BUN: 24 mg/dL — ABNORMAL HIGH (ref 6–23)
BUN: 33 mg/dL — AB (ref 6–23)
BUN: 34 mg/dL — AB (ref 6–23)
BUN: 36 mg/dL — AB (ref 6–23)
BUN: 38 mg/dL — AB (ref 6–23)
CHLORIDE: 115 meq/L — AB (ref 96–112)
CHLORIDE: 114 meq/L — AB (ref 96–112)
CHLORIDE: 114 meq/L — AB (ref 96–112)
CO2: 10 mEq/L — CL (ref 19–32)
CO2: 13 mEq/L — ABNORMAL LOW (ref 19–32)
CO2: 14 mEq/L — ABNORMAL LOW (ref 19–32)
CO2: 17 mEq/L — ABNORMAL LOW (ref 19–32)
CO2: 19 mEq/L (ref 19–32)
CO2: 13 meq/L — AB (ref 19–32)
CO2: 18 meq/L — AB (ref 19–32)
CO2: 20 meq/L (ref 19–32)
CREATININE: 2.08 mg/dL — AB (ref 0.50–1.35)
CREATININE: 2.13 mg/dL — AB (ref 0.50–1.35)
Calcium: 8.1 mg/dL — ABNORMAL LOW (ref 8.4–10.5)
Calcium: 8.3 mg/dL — ABNORMAL LOW (ref 8.4–10.5)
Calcium: 8.3 mg/dL — ABNORMAL LOW (ref 8.4–10.5)
Calcium: 8.6 mg/dL (ref 8.4–10.5)
Calcium: 8.5 mg/dL (ref 8.4–10.5)
Calcium: 8.4 mg/dL (ref 8.4–10.5)
Calcium: 8.6 mg/dL (ref 8.4–10.5)
Calcium: 8.3 mg/dL — ABNORMAL LOW (ref 8.4–10.5)
Chloride: 106 mEq/L (ref 96–112)
Chloride: 109 mEq/L (ref 96–112)
Chloride: 110 mEq/L (ref 96–112)
Chloride: 106 mEq/L (ref 96–112)
Chloride: 111 mEq/L (ref 96–112)
Creatinine, Ser: 2.31 mg/dL — ABNORMAL HIGH (ref 0.50–1.35)
Creatinine, Ser: 2.29 mg/dL — ABNORMAL HIGH (ref 0.50–1.35)
Creatinine, Ser: 2.12 mg/dL — ABNORMAL HIGH (ref 0.50–1.35)
Creatinine, Ser: 1.91 mg/dL — ABNORMAL HIGH (ref 0.50–1.35)
Creatinine, Ser: 1.85 mg/dL — ABNORMAL HIGH (ref 0.50–1.35)
Creatinine, Ser: 1.69 mg/dL — ABNORMAL HIGH (ref 0.50–1.35)
GFR calc Af Amer: 47 mL/min — ABNORMAL LOW (ref >90)
GFR calc Af Amer: 45 mL/min — ABNORMAL LOW (ref >90)
GFR calc Af Amer: 52 mL/min — ABNORMAL LOW (ref >90)
GFR calc Af Amer: 54 mL/min — ABNORMAL LOW (ref >90)
GFR calc Af Amer: 60 mL/min — ABNORMAL LOW (ref >90)
GFR calc non Af Amer: 39 mL/min — ABNORMAL LOW (ref >90)
GFR calc non Af Amer: 45 mL/min — ABNORMAL LOW (ref >90)
GFR calc non Af Amer: 46 mL/min — ABNORMAL LOW (ref >90)
GFR calc non Af Amer: 52 mL/min — ABNORMAL LOW (ref >90)
GFR, EST AFRICAN AMERICAN: 41 mL/min — AB (ref >90)
GFR, EST AFRICAN AMERICAN: 41 mL/min — AB (ref >90)
GFR, EST AFRICAN AMERICAN: 46 mL/min — AB (ref >90)
GFR, EST NON AFRICAN AMERICAN: 35 mL/min — AB (ref >90)
GFR, EST NON AFRICAN AMERICAN: 36 mL/min — AB (ref >90)
GFR, EST NON AFRICAN AMERICAN: 39 mL/min — AB (ref >90)
GFR, EST NON AFRICAN AMERICAN: 40 mL/min — AB (ref >90)
GLUCOSE: 319 mg/dL — AB (ref 70–99)
Glucose, Bld: 423 mg/dL — ABNORMAL HIGH (ref 70–99)
Glucose, Bld: 189 mg/dL — ABNORMAL HIGH (ref 70–99)
Glucose, Bld: 111 mg/dL — ABNORMAL HIGH (ref 70–99)
Glucose, Bld: 92 mg/dL (ref 70–99)
Glucose, Bld: 128 mg/dL — ABNORMAL HIGH (ref 70–99)
Glucose, Bld: 386 mg/dL — ABNORMAL HIGH (ref 70–99)
Glucose, Bld: 257 mg/dL — ABNORMAL HIGH (ref 70–99)
POTASSIUM: 5.3 meq/L (ref 3.7–5.3)
POTASSIUM: 4.2 meq/L (ref 3.7–5.3)
POTASSIUM: 4.7 meq/L (ref 3.7–5.3)
POTASSIUM: 4.8 meq/L (ref 3.7–5.3)
Potassium: 4 mEq/L (ref 3.7–5.3)
Potassium: 4.3 mEq/L (ref 3.7–5.3)
Potassium: 4.5 mEq/L (ref 3.7–5.3)
Potassium: 4.3 mEq/L (ref 3.7–5.3)
SODIUM: 138 meq/L (ref 137–147)
SODIUM: 142 meq/L (ref 137–147)
SODIUM: 145 meq/L (ref 137–147)
SODIUM: 141 meq/L (ref 137–147)
SODIUM: 141 meq/L (ref 137–147)
SODIUM: 143 meq/L (ref 137–147)
Sodium: 146 mEq/L (ref 137–147)
Sodium: 145 mEq/L (ref 137–147)

## 2014-04-03 LAB — MAGNESIUM
MAGNESIUM: 2.8 mg/dL — AB (ref 1.5–2.5)
MAGNESIUM: 2.7 mg/dL — AB (ref 1.5–2.5)
MAGNESIUM: 2.6 mg/dL — AB (ref 1.5–2.5)
Magnesium: 3.2 mg/dL — ABNORMAL HIGH (ref 1.5–2.5)

## 2014-04-03 LAB — GLUCOSE, CAPILLARY
GLUCOSE-CAPILLARY: 114 mg/dL — AB (ref 70–99)
GLUCOSE-CAPILLARY: 131 mg/dL — AB (ref 70–99)
GLUCOSE-CAPILLARY: 162 mg/dL — AB (ref 70–99)
GLUCOSE-CAPILLARY: 208 mg/dL — AB (ref 70–99)
GLUCOSE-CAPILLARY: 241 mg/dL — AB (ref 70–99)
GLUCOSE-CAPILLARY: 255 mg/dL — AB (ref 70–99)
GLUCOSE-CAPILLARY: 273 mg/dL — AB (ref 70–99)
GLUCOSE-CAPILLARY: 321 mg/dL — AB (ref 70–99)
GLUCOSE-CAPILLARY: 383 mg/dL — AB (ref 70–99)
GLUCOSE-CAPILLARY: 435 mg/dL — AB (ref 70–99)
Glucose-Capillary: 112 mg/dL — ABNORMAL HIGH (ref 70–99)
Glucose-Capillary: 116 mg/dL — ABNORMAL HIGH (ref 70–99)
Glucose-Capillary: 134 mg/dL — ABNORMAL HIGH (ref 70–99)
Glucose-Capillary: 173 mg/dL — ABNORMAL HIGH (ref 70–99)
Glucose-Capillary: 91 mg/dL (ref 70–99)
Glucose-Capillary: 98 mg/dL (ref 70–99)

## 2014-04-03 LAB — LACTIC ACID, PLASMA
LACTIC ACID, VENOUS: 2.3 mmol/L — AB (ref 0.5–2.2)
LACTIC ACID, VENOUS: 2.3 mmol/L — AB (ref 0.5–2.2)
Lactic Acid, Venous: 2.1 mmol/L (ref 0.5–2.2)

## 2014-04-03 LAB — MRSA PCR SCREENING: MRSA BY PCR: NEGATIVE

## 2014-04-03 LAB — PHOSPHORUS
PHOSPHORUS: 2.6 mg/dL (ref 2.3–4.6)
Phosphorus: 0.6 mg/dL — CL (ref 2.3–4.6)

## 2014-04-03 LAB — TRIGLYCERIDES: Triglycerides: 135 mg/dL (ref <150)

## 2014-04-03 MED ORDER — INSULIN ASPART 100 UNIT/ML ~~LOC~~ SOLN
0.0000 [IU] | SUBCUTANEOUS | Status: DC
  Administered 2014-04-03: 8 [IU] via SUBCUTANEOUS
  Administered 2014-04-03: 5 [IU] via SUBCUTANEOUS
  Administered 2014-04-04: 8 [IU] via SUBCUTANEOUS
  Administered 2014-04-04 (×2): 3 [IU] via SUBCUTANEOUS
  Administered 2014-04-04: 11 [IU] via SUBCUTANEOUS
  Administered 2014-04-05: 2 [IU] via SUBCUTANEOUS
  Administered 2014-04-05 – 2014-04-06 (×3): 3 [IU] via SUBCUTANEOUS
  Administered 2014-04-06: 5 [IU] via SUBCUTANEOUS

## 2014-04-03 MED ORDER — FAMOTIDINE IN NACL 20-0.9 MG/50ML-% IV SOLN
20.0000 mg | Freq: Two times a day (BID) | INTRAVENOUS | Status: DC
  Administered 2014-04-03 – 2014-04-06 (×8): 20 mg via INTRAVENOUS
  Filled 2014-04-03 (×9): qty 50

## 2014-04-03 MED ORDER — SODIUM PHOSPHATE 3 MMOLE/ML IV SOLN
20.0000 mmol | Freq: Once | INTRAVENOUS | Status: AC
  Administered 2014-04-03: 20 mmol via INTRAVENOUS
  Filled 2014-04-03: qty 6.67

## 2014-04-03 NOTE — Progress Notes (Signed)
Utilization Review Completed.   Edyn Popoca, RN, BSN Nurse Case Manager  

## 2014-04-03 NOTE — Progress Notes (Signed)
PULMONARY / CRITICAL CARE MEDICINE   Name: Matthew [name] MRN: [id number] DOB: 10-07-1980    ADMISSION DATE:  04/02/2014  PRIMARY SERVICE: PCCM  CHIEF COMPLAINT:  AMS  BRIEF PATIENT DESCRIPTION: 33 y/o man with DM1, hx of substance abuse p/w w/ severe DKA after missing insulin, drinking, and taking cocaine.  SIGNIFICANT EVENTS: 10/03 Severe Acidosis in ED, intubated due to inability to protect airway  STUDIES: 10/03 CT head >> no acute findings  SUBJECTIVE:  Tolerates some pressure support.  VITAL SIGNS: Temp:  [90.7 F (32.6 C)-100.6 F (38.1 C)] 100.3 F (37.9 C) (10/04 1000) Pulse Rate:  [100-147] 107 (10/04 1000) Resp:  [20-36] 30 (10/04 1000) BP: (65-162)/(31-93) 120/64 mmHg (10/04 1000) SpO2:  [97 %-100 %] 100 % (10/04 1000) FiO2 (%):  [40 %-60 %] 40 % (10/04 1000) Weight:  [170 lb (77.111 kg)-170 lb 10.2 oz (77.4 kg)] 170 lb 10.2 oz (77.4 kg) (10/04 0427) VENTILATOR SETTINGS: Vent Mode:  [-] PRVC FiO2 (%):  [40 %-60 %] 40 % Set Rate:  [30 bmp] 30 bmp Vt Set:  [550 mL] 550 mL PEEP:  [5 cmH20] 5 cmH20 Plateau Pressure:  [21 cmH20-24 cmH20] 21 cmH20 INTAKE / OUTPUT: Intake/Output     10/03 0701 - 10/04 0700 10/04 0701 - 10/05 0700   I.V. (mL/kg) 1138.4 (14.7) 454.6 (5.9)   IV Piggyback 1150    Total Intake(mL/kg) 2288.4 (29.6) 454.6 (5.9)   Urine (mL/kg/hr) 3800    Emesis/NG output 600    Total Output 4400     Net -2111.6 +454.6          PHYSICAL EXAMINATION: General: no distress Neuro: RASS -2 HEENT: ETT in place Cardiovascular:  Tachycardia, regular Lungs: no wheeze Abdomen:  Soft, non tender Musculoskeletal: no edema Skin: no rashes  LABS:  CBC  Recent Labs Lab 04/02/14 1826 04/03/14 0040  WBC 24.0* 23.8*  HGB 15.7 14.8  HCT 51.1 43.4  PLT 369 325   Coag's  Recent Labs Lab 04/02/14 1826  INR 1.34   BMET  Recent Labs Lab 04/03/14 0445 04/03/14 0710 04/03/14 0912  NA 141 146 145  K 4.0 4.3 4.2  CL 110 115* 114*  CO2 14*  17* 19  BUN 36* 34* 33*  CREATININE 2.12* 2.08* 2.13*  GLUCOSE 189* 111* 92   Electrolytes  Recent Labs Lab 04/03/14 0040  04/03/14 0445 04/03/14 0710 04/03/14 0912  CALCIUM 8.1*  < > 8.3* 8.6 8.5  MG 3.2*  --  2.8* 2.7*  --   < > = values in this interval not displayed. Sepsis Markers  Recent Labs Lab 04/03/14 0040 04/03/14 0445 04/03/14 0710  LATICACIDVEN 2.1 2.3* 2.3*   ABG  Recent Labs Lab 04/02/14 1945 04/02/14 1952 04/02/14 2144  PHART 7.007* 7.053* 6.999*  PCO2ART 18.8* 16.0* 29.4*  PO2ART 61.0* 45.0* 106.0*   Liver Enzymes  Recent Labs Lab 04/02/14 1826  AST 47*  ALT 23  ALKPHOS 141*  BILITOT 0.3  ALBUMIN 3.8   Glucose  Recent Labs Lab 04/03/14 0152 04/03/14 0257 04/03/14 0406 04/03/14 0513 04/03/14 0727 04/03/14 0837  GLUCAP 321* 255* 208* 162* 112* 98    Imaging Ct Head Wo Contrast  04/02/2014   CLINICAL DATA:  Hyperglycemia and altered mental status. Possible intoxication.  EXAM: CT HEAD WITHOUT CONTRAST  TECHNIQUE: Contiguous axial images were obtained from the base of the skull through the vertex without intravenous contrast.  COMPARISON:  None.  FINDINGS: Ventricles, cisterns and other CSF spaces are within normal.  There is no mass, mass effect, shift of midline structures or acute hemorrhage. There is no evidence to suggest acute infarction. Bones and soft tissues are unremarkable.  IMPRESSION: No acute intracranial findings.   Electronically Signed   By: Elberta Fortisaniel  Boyle M.D.   On: 04/02/2014 20:46   Dg Chest Port 1 View  04/03/2014   CLINICAL DATA:  Atelectasis  EXAM: PORTABLE CHEST - 1 VIEW  COMPARISON:  04/02/2014  FINDINGS: Nasogastric tube has been placed. Endotracheal tube stable. Slightly improved aeration with some mild residual left retrocardiac subsegmental atelectasis. No effusion. Visualized skeletal structures are unremarkable.  IMPRESSION: 1. Interval nasogastric tube placement to the stomach. 2. Improved aeration with mild  residual left retrocardiac atelectasis. PICC   Electronically Signed   By: Oley Balmaniel  Hassell M.D.   On: 04/03/2014 08:35   Dg Chest Portable 1 View  04/02/2014   CLINICAL DATA:  Altered mental status.  Hyperglycemia.  EXAM: PORTABLE CHEST - 1 VIEW  COMPARISON:  04/02/2014  FINDINGS: Interval placement of endotracheal tube with tip measuring 2.3 cm above the carina. Shallow inspiration. Atelectasis in the lung bases. No focal airspace consolidation. The heart size and mediastinal contours are within normal limits. The visualized skeletal structures are unremarkable.  IMPRESSION: Endotracheal tube tip measures 2.3 cm above the carina. Shallow inspiration with atelectasis in the lung bases.   Electronically Signed   By: Burman NievesWilliam  Stevens M.D.   On: 04/02/2014 21:11   Dg Chest Port 1 View  04/02/2014   CLINICAL DATA:  Hyperglycemia and altered mental status.  EXAM: PORTABLE CHEST - 1 VIEW  COMPARISON:  None.  FINDINGS: The heart size and mediastinal contours are within normal limits. Both lungs are clear. The visualized skeletal structures are unremarkable.  IMPRESSION: No active disease.   Electronically Signed   By: Elberta Fortisaniel  Boyle M.D.   On: 04/02/2014 19:15   Dg Abd Portable 1v  04/03/2014   CLINICAL DATA:  Encounter for imaging study to confirm orogastric (OG) tube placement of  EXAM: PORTABLE ABDOMEN - 1 VIEW  COMPARISON:  None.  FINDINGS: Nasogastric tube extends to the pylorus. Normal bowel gas pattern. Regional bones unremarkable.  IMPRESSION: 1. Nasogastric tube to the pylorus.   Electronically Signed   By: Oley Balmaniel  Hassell M.D.   On: 04/03/2014 08:34     ASSESSMENT / PLAN:  PULMONARY A: Acute respiratory failure 2nd to altered mental status and acidosis. Tobacco abuse. P:   Pressure support wean >> extubate when mental status better F/u CXR, ABG  CARDIOVASCULAR A: Sinus tachycardia 2nd to hypovolemia. P:   Monitor hemodynamics  RENAL A: Acute Kidney Injury 2nd to  hypovolemia. Hyperkalemia from DKA >> resolved. P:   Monitor renal fx, urine outpt F/u electrolytes  GASTROINTESTINAL A: Nutrition. P:   NPO for now Pepcid for SUP  HEMATOLOGIC A: Leukocytosis likely from acute stress. P:   F/u CBC SQ heparin for DVT prevention  INFECTIOUS A: No evidence for infection P:   Monitor clinically  Blood 10/03 >>  ENDOCRINE A: DKA. P:   Insulin gtt until gap closed Dextrose in IV fluid not that blood sugar < 250  NEUROLOGIC A: AMS due to metabolic encephalopathy and cocaine intoxication P:   RASS goal: 0  Family Update:  Interdisciplinary Meeting:  CC time 40 minutes.  Coralyn HellingVineet Sharanda Shinault, MD [facility]monary/Critical Care 04/03/2014, 11:10 AM Pager:  [phone] After 3pm call: [phone]

## 2014-04-04 ENCOUNTER — Inpatient Hospital Stay (HOSPITAL_COMMUNITY): Payer: Self-pay

## 2014-04-04 DIAGNOSIS — E1011 Type 1 diabetes mellitus with ketoacidosis with coma: Secondary | ICD-10-CM

## 2014-04-04 LAB — BASIC METABOLIC PANEL
ANION GAP: 12 (ref 5–15)
ANION GAP: 14 (ref 5–15)
Anion gap: 9 (ref 5–15)
BUN: 22 mg/dL (ref 6–23)
BUN: 20 mg/dL (ref 6–23)
BUN: 20 mg/dL (ref 6–23)
CALCIUM: 8.5 mg/dL (ref 8.4–10.5)
CALCIUM: 8.6 mg/dL (ref 8.4–10.5)
CHLORIDE: 112 meq/L (ref 96–112)
CO2: 20 mEq/L (ref 19–32)
CO2: 18 mEq/L — ABNORMAL LOW (ref 19–32)
CO2: 18 mEq/L — ABNORMAL LOW (ref 19–32)
CREATININE: 1.66 mg/dL — AB (ref 0.50–1.35)
Calcium: 8.2 mg/dL — ABNORMAL LOW (ref 8.4–10.5)
Chloride: 115 mEq/L — ABNORMAL HIGH (ref 96–112)
Chloride: 108 mEq/L (ref 96–112)
Creatinine, Ser: 1.6 mg/dL — ABNORMAL HIGH (ref 0.50–1.35)
Creatinine, Ser: 1.74 mg/dL — ABNORMAL HIGH (ref 0.50–1.35)
GFR calc Af Amer: 61 mL/min — ABNORMAL LOW (ref >90)
GFR calc non Af Amer: 53 mL/min — ABNORMAL LOW (ref >90)
GFR, EST AFRICAN AMERICAN: 64 mL/min — AB (ref >90)
GFR, EST AFRICAN AMERICAN: 58 mL/min — AB (ref >90)
GFR, EST NON AFRICAN AMERICAN: 55 mL/min — AB (ref >90)
GFR, EST NON AFRICAN AMERICAN: 50 mL/min — AB (ref >90)
GLUCOSE: 164 mg/dL — AB (ref 70–99)
Glucose, Bld: 165 mg/dL — ABNORMAL HIGH (ref 70–99)
Glucose, Bld: 371 mg/dL — ABNORMAL HIGH (ref 70–99)
POTASSIUM: 4.5 meq/L (ref 3.7–5.3)
Potassium: 4.3 mEq/L (ref 3.7–5.3)
Potassium: 5 mEq/L (ref 3.7–5.3)
SODIUM: 144 meq/L (ref 137–147)
SODIUM: 142 meq/L (ref 137–147)
Sodium: 140 mEq/L (ref 137–147)

## 2014-04-04 LAB — CBC
HEMATOCRIT: 39 % (ref 39.0–52.0)
Hemoglobin: 13.6 g/dL (ref 13.0–17.0)
MCH: 27.6 pg (ref 26.0–34.0)
MCHC: 34.9 g/dL (ref 30.0–36.0)
MCV: 79.3 fL (ref 78.0–100.0)
Platelets: 275 10*3/uL (ref 150–400)
RBC: 4.92 MIL/uL (ref 4.22–5.81)
RDW: 16.9 % — AB (ref 11.5–15.5)
WBC: 11.3 10*3/uL — AB (ref 4.0–10.5)

## 2014-04-04 LAB — POCT I-STAT 3, VENOUS BLOOD GAS (G3P V)
Acid-base deficit: 11 mmol/L — ABNORMAL HIGH (ref 0.0–2.0)
Acid-base deficit: 8 mmol/L — ABNORMAL HIGH (ref 0.0–2.0)
Bicarbonate: 13.9 mEq/L — ABNORMAL LOW (ref 20.0–24.0)
Bicarbonate: 16.1 mEq/L — ABNORMAL LOW (ref 20.0–24.0)
O2 SAT: 92 %
O2 SAT: 63 %
PO2 VEN: 75 mmHg — AB (ref 30.0–45.0)
Patient temperature: 100.5
Patient temperature: 98.6
TCO2: 15 mmol/L (ref 0–100)
TCO2: 17 mmol/L (ref 0–100)
pCO2, Ven: 28.6 mmHg — ABNORMAL LOW (ref 45.0–50.0)
pCO2, Ven: 28.6 mmHg — ABNORMAL LOW (ref 45.0–50.0)
pH, Ven: 7.3 (ref 7.250–7.300)
pH, Ven: 7.36 — ABNORMAL HIGH (ref 7.250–7.300)
pO2, Ven: 34 mmHg (ref 30.0–45.0)

## 2014-04-04 LAB — GLUCOSE, CAPILLARY
GLUCOSE-CAPILLARY: 529 mg/dL — AB (ref 70–99)
GLUCOSE-CAPILLARY: 77 mg/dL (ref 70–99)
Glucose-Capillary: 120 mg/dL — ABNORMAL HIGH (ref 70–99)
Glucose-Capillary: 316 mg/dL — ABNORMAL HIGH (ref 70–99)
Glucose-Capillary: 261 mg/dL — ABNORMAL HIGH (ref 70–99)
Glucose-Capillary: 163 mg/dL — ABNORMAL HIGH (ref 70–99)

## 2014-04-04 LAB — POCT I-STAT 3, ART BLOOD GAS (G3+)
Acid-base deficit: 4 mmol/L — ABNORMAL HIGH (ref 0.0–2.0)
Bicarbonate: 22.3 mEq/L (ref 20.0–24.0)
O2 Saturation: 100 %
PCO2 ART: 43.3 mmHg (ref 35.0–45.0)
Patient temperature: 98.9
TCO2: 24 mmol/L (ref 0–100)
pH, Arterial: 7.321 — ABNORMAL LOW (ref 7.350–7.450)
pO2, Arterial: 190 mmHg — ABNORMAL HIGH (ref 80.0–100.0)

## 2014-04-04 LAB — HEMOGLOBIN A1C
HEMOGLOBIN A1C: 9.9 % — AB (ref <5.7)
Mean Plasma Glucose: 237 mg/dL — ABNORMAL HIGH (ref <117)

## 2014-04-04 LAB — MAGNESIUM: MAGNESIUM: 2.5 mg/dL (ref 1.5–2.5)

## 2014-04-04 MED ORDER — SODIUM CHLORIDE 0.9 % IV SOLN
INTRAVENOUS | Status: DC
  Administered 2014-04-04: 13:00:00 via INTRAVENOUS

## 2014-04-04 MED ORDER — INSULIN GLARGINE 100 UNIT/ML ~~LOC~~ SOLN
15.0000 [IU] | Freq: Every day | SUBCUTANEOUS | Status: DC
  Administered 2014-04-04 – 2014-04-05 (×2): 15 [IU] via SUBCUTANEOUS
  Filled 2014-04-04 (×2): qty 0.15

## 2014-04-04 MED ORDER — CHLORHEXIDINE GLUCONATE 0.12 % MT SOLN
15.0000 mL | Freq: Two times a day (BID) | OROMUCOSAL | Status: DC
  Administered 2014-04-05 – 2014-04-06 (×3): 15 mL via OROMUCOSAL
  Filled 2014-04-04 (×3): qty 15

## 2014-04-04 MED ORDER — CETYLPYRIDINIUM CHLORIDE 0.05 % MT LIQD
7.0000 mL | Freq: Two times a day (BID) | OROMUCOSAL | Status: DC
  Administered 2014-04-05 – 2014-04-06 (×3): 7 mL via OROMUCOSAL

## 2014-04-04 NOTE — Procedures (Signed)
Extubation Procedure Note  Patient Details:   Name: Matthew [name] DOB: July 18, 1980 MRN: [id number]   Airway Documentation:     Evaluation  O2 sats: stable throughout Complications: No apparent complications Patient did tolerate procedure well. Bilateral Breath Sounds: Clear Suctioning: Oral No, pt is not speaking, and does not follow commands-prior to extubation.  MD aware.  No stridor noted, no resp distress noted.  RN at bedside.   Jennette KettleBrowning, Khaliya Golinski Joy 04/04/2014, 1:00 PM

## 2014-04-04 NOTE — Progress Notes (Signed)
Per NP, PS weaned to 5 ps.  Pt tol well so far, no distress noted.

## 2014-04-04 NOTE — Progress Notes (Signed)
RN called Elink spoke with Dr Delford FieldWright about patient having bloody urine. MD made aware that pt is on SQ Heparin. MD wants to continue monitoring patients urine at this time and also continue SQ Heparin.  Charlann BoxerBurnham, Saroya Riccobono M

## 2014-04-04 NOTE — Progress Notes (Signed)
RR increased to 37-39.  Increased HR.  RN admin sedation meds.  PS increased to 10, RN aware.

## 2014-04-04 NOTE — Clinical Documentation Improvement (Signed)
Please clarify underlying diagnosis responsible for the clinical indicators listed below:  1. WBCs=24.0, 23.8 2. Neut=18.9 3. WBC Morphology=Mild left shift 4. Lactic Acid=8.52; 2.3 5. Temp=10; 100.1; 100.2 6. B/P=82/31; 65/50; 109/52; 105/54 7. HR=102;108; 113; 122; 142 8. Resp Rate= 30;34; 32; 36  Based on the above clinical indicators please clarify if pt qualifies for one of the conditions listed below or other condition and document in pn or d/c summary.   Possible Clinical Conditions? Please list organism (if known) and if POA.   Septicemia / Sepsis  Severe Sepsis   SIRS  Septic Shock   Other Condition   Cannot clinically Determine   Risk Factors: Cocaine abuse/intoxication, DKA, Met encephalopathy, hyperkalemia, Acute Resp failure w/ hypoxia  Signs/symptoms: Leukocytosis  Treatment:  vancomycin (VANCOCIN) IVPB 1000 mg/200 mL premix  sodium chloride 0.9 % bolus 2,000 mL   sodium chloride 0.9 % bolus 1,000 mL x 2   Thank You, Heloise Beecham ,RN Clinical Documentation Specialist:  [phone]  [place] Information Management

## 2014-04-04 NOTE — Progress Notes (Signed)
PULMONARY / CRITICAL CARE MEDICINE   Name: Matthew [name] MRN: [id number] DOB: 1981/03/03    ADMISSION DATE:  04/02/2014  PRIMARY SERVICE: PCCM  CHIEF COMPLAINT:  AMS  BRIEF PATIENT DESCRIPTION: [age] y/o man with DM1, hx of substance abuse p/w w/ unconsciousness, severe DKA after missing insulin, drinking, and taking cocaine.  SIGNIFICANT EVENTS: 10/03 Severe Acidosis in ED, intubated due to inability to protect airway  STUDIES: 10/03 CT head >> no acute findings  SUBJECTIVE:  On PS ventilation but agitated when not sedated.  VITAL SIGNS: Temp:  [99 F (37.2 C)-100.4 F (38 C)] 99 F (37.2 C) (10/05 1100) Pulse Rate:  [88-115] 88 (10/05 1100) Resp:  [14-24] 24 (10/05 0914) BP: (111-143)/(57-85) 128/75 mmHg (10/05 1100) SpO2:  [99 %-100 %] 100 % (10/05 1100) FiO2 (%):  [40 %] 40 % (10/05 0914) Weight:  [174 lb 9.7 oz (79.2 kg)] 174 lb 9.7 oz (79.2 kg) (10/05 0500) VENTILATOR SETTINGS: Vent Mode:  [-] CPAP;PSV FiO2 (%):  [40 %] 40 % Set Rate:  [14 bmp] 14 bmp Vt Set:  [550 mL] 550 mL PEEP:  [5 cmH20] 5 cmH20 Pressure Support:  [5 cmH20-10 cmH20] 10 cmH20 Plateau Pressure:  [19 cmH20-21 cmH20] 19 cmH20 INTAKE / OUTPUT: Intake/Output     10/04 0701 - 10/05 0700 10/05 0701 - 10/06 0700   I.V. (mL/kg) 3350.2 (42.3) 702.2 (8.9)   NG/GT  30   IV Piggyback 229 50   Total Intake(mL/kg) 3579.2 (45.2) 782.2 (9.9)   Urine (mL/kg/hr) 1270 (0.7) 235 (0.6)   Emesis/NG output  175 (0.5)   Total Output 1270 410   Net +2309.2 +372.2          PHYSICAL EXAMINATION: General: no distress Neuro: RASS -2, on diprian drip HEENT: ETT in place Cardiovascular:  Tachycardia, regular Lungs: no wheeze Abdomen:  Soft, non tender Musculoskeletal: no edema Skin: no rashes  LABS:  CBC  Recent Labs Lab 04/02/14 1826 04/03/14 0040 04/04/14 0502  WBC 24.0* 23.8* 11.3*  HGB 15.7 14.8 13.6  HCT 51.1 43.4 39.0  PLT 369 325 275   Coag's  Recent Labs Lab 04/02/14 1826  INR 1.34    BMET  Recent Labs Lab 04/04/14 0100 04/04/14 0502 04/04/14 0825  NA 144 142 140  K 4.3 4.5 5.0  CL 115* 112 108  CO2 20 18* 18*  BUN 22 20 20   CREATININE 1.66* 1.60* 1.74*  GLUCOSE 164* 165* 371*   Electrolytes  Recent Labs Lab 04/03/14 0710 04/03/14 0912 04/03/14 1108  04/03/14 1930  04/03/14 2239 04/04/14 0100 04/04/14 0502 04/04/14 0825  CALCIUM 8.6 8.5  --   < >  --   < >  --  8.5 8.2* 8.6  MG 2.7*  --   --   --  2.6*  --   --   --  2.5  --   PHOS  --   --  0.6*  --   --   --  2.6  --   --   --   < > = values in this interval not displayed. Sepsis Markers  Recent Labs Lab 04/03/14 0040 04/03/14 0445 04/03/14 0710  LATICACIDVEN 2.1 2.3* 2.3*   ABG  Recent Labs Lab 04/02/14 1945 04/02/14 1952 04/02/14 2144  PHART 7.007* 7.053* 6.999*  PCO2ART 18.8* 16.0* 29.4*  PO2ART 61.0* 45.0* 106.0*   Liver Enzymes  Recent Labs Lab 04/02/14 1826  AST 47*  ALT 23  ALKPHOS 141*  BILITOT 0.3  ALBUMIN 3.8  Glucose  Recent Labs Lab 04/03/14 1334 04/03/14 1526 04/03/14 1911 04/03/14 2346 04/04/14 0310 04/04/14 0745  GLUCAP 131* 241* 273* 173* 120* 316*    Imaging Ct Head Wo Contrast  04/02/2014   CLINICAL DATA:  Hyperglycemia and altered mental status. Possible intoxication.  EXAM: CT HEAD WITHOUT CONTRAST  TECHNIQUE: Contiguous axial images were obtained from the base of the skull through the vertex without intravenous contrast.  COMPARISON:  None.  FINDINGS: Ventricles, cisterns and other CSF spaces are within normal. There is no mass, mass effect, shift of midline structures or acute hemorrhage. There is no evidence to suggest acute infarction. Bones and soft tissues are unremarkable.  IMPRESSION: No acute intracranial findings.   Electronically Signed   By: Elberta Fortis M.D.   On: 04/02/2014 20:46   Dg Chest Port 1 View  04/04/2014   CLINICAL DATA:  33 year old male with shortness of breath and atelectasis described on Prior chest x-ray. The  patient is intubated. Evaluate for position of endotracheal tube.  EXAM: PORTABLE CHEST - 1 VIEW  COMPARISON:  Prior chest x-ray 04/03/2014  FINDINGS: The patient remains intubated. The endotracheal tube remains in good position 3.7 cm above the carina. A nasogastric tube is present with the tip overlying the greater curvature of the stomach. Stable cardiac and mediastinal contours. Borderline cardiomegaly is likely exaggerated by low inspiratory volumes. Inspiratory volumes remain low with mild bibasilar atelectasis. No new infiltrate, pleural effusion or pneumothorax.  IMPRESSION: 1. Persistent low inspiratory volumes with bibasilar subsegmental atelectasis. 2. Stable and satisfactory support apparatus.   Electronically Signed   By: Malachy Moan M.D.   On: 04/04/2014 07:40   Dg Chest Port 1 View  04/03/2014   CLINICAL DATA:  Atelectasis  EXAM: PORTABLE CHEST - 1 VIEW  COMPARISON:  04/02/2014  FINDINGS: Nasogastric tube has been placed. Endotracheal tube stable. Slightly improved aeration with some mild residual left retrocardiac subsegmental atelectasis. No effusion. Visualized skeletal structures are unremarkable.  IMPRESSION: 1. Interval nasogastric tube placement to the stomach. 2. Improved aeration with mild residual left retrocardiac atelectasis. PICC   Electronically Signed   By: Oley Balm M.D.   On: 04/03/2014 08:35   Dg Chest Portable 1 View  04/02/2014   CLINICAL DATA:  Altered mental status.  Hyperglycemia.  EXAM: PORTABLE CHEST - 1 VIEW  COMPARISON:  04/02/2014  FINDINGS: Interval placement of endotracheal tube with tip measuring 2.3 cm above the carina. Shallow inspiration. Atelectasis in the lung bases. No focal airspace consolidation. The heart size and mediastinal contours are within normal limits. The visualized skeletal structures are unremarkable.  IMPRESSION: Endotracheal tube tip measures 2.3 cm above the carina. Shallow inspiration with atelectasis in the lung bases.    Electronically Signed   By: Burman Nieves M.D.   On: 04/02/2014 21:11   Dg Chest Port 1 View  04/02/2014   CLINICAL DATA:  Hyperglycemia and altered mental status.  EXAM: PORTABLE CHEST - 1 VIEW  COMPARISON:  None.  FINDINGS: The heart size and mediastinal contours are within normal limits. Both lungs are clear. The visualized skeletal structures are unremarkable.  IMPRESSION: No active disease.   Electronically Signed   By: Elberta Fortis M.D.   On: 04/02/2014 19:15   Dg Abd Portable 1v  04/03/2014   CLINICAL DATA:  Encounter for imaging study to confirm orogastric (OG) tube placement of  EXAM: PORTABLE ABDOMEN - 1 VIEW  COMPARISON:  None.  FINDINGS: Nasogastric tube extends to the pylorus. Normal bowel gas  pattern. Regional bones unremarkable.  IMPRESSION: 1. Nasogastric tube to the pylorus.   Electronically Signed   By: Oley Balm M.D.   On: 04/03/2014 08:34     ASSESSMENT / PLAN:  PULMONARY A: Acute respiratory failure 2nd to altered mental status and acidosis. Tobacco abuse. P:   Pressure support wean >> extubate when mental status better 10/5 F/u CXR, ABG  CARDIOVASCULAR A: Sinus tachycardia 2nd to hypovolemia. P:   Monitor hemodynamics  RENAL Lab Results  Component Value Date   CREATININE 1.74* 04/04/2014   CREATININE 1.60* 04/04/2014   CREATININE 1.66* 04/04/2014    Recent Labs Lab 04/04/14 0100 04/04/14 0502 04/04/14 0825  K 4.3 4.5 5.0    A: Acute Kidney Injury 2nd to hypovolemia. Hyperkalemia from DKA >> resolved. P:   Monitor renal fx, urine outpt F/u electrolytes  GASTROINTESTINAL A: Nutrition. P:   NPO for now Pepcid for SUP  HEMATOLOGIC A: Leukocytosis likely from acute stress. P:   F/u CBC SQ heparin for DVT prevention  INFECTIOUS Blood 10/03 >> A: No evidence for infection P:   Monitor clinically   ENDOCRINE A: DKA. P:   Insulin gtt stopped 10/4, now on SSI Add lantus 10/5 AG 14 on 10/5 am  NEUROLOGIC A: AMS due to  metabolic encephalopathy and cocaine intoxication P:   RASS goal: 0  Family Update: Uncle at bedside. Interdisciplinary Meeting:    Brett Canales Minor ACNP [facility] PCCM Pager [phone] till 3 pm If no answer page [phone] 04/04/2014, 11:40 AM  40 minutes CC time  Levy Pupa, MD, PhD 04/04/2014, 1:10 PM [facility] [phone] or if no answer [phone]

## 2014-04-04 NOTE — Clinical Documentation Improvement (Signed)
Please consider the clinical data listed below and clarify whether or not pt's current clinical status is reflected in one of the diagnoses listed below or another diagnosis and document in pn or d/c summary.  EKG 04/02/2014 states pt with Sinus Tachycardia, left inferior infarct acute   Possible Clinical Conditions? ____________________  ____________________ ____________________ _______Other Condition__________________ _______Cannot Clinically Determine   Supporting Information: Risk Factors: Signs & Symptoms: Cocaine abuse/intoxication, DKA, Met encephalopathy, hyperkalemia, Acute Resp failure w/ hypoxia [place]  Diagnostics: Noted above  Treatment  aspirin chewable tablet 324 mg  calcium gluconate 1 g in sodium chloride 0.9 % 100 mL IVPB   Thank You, Heloise Beecham ,RN Clinical Documentation Specialist:  [place] Information Management

## 2014-04-04 NOTE — Progress Notes (Signed)
Inpatient Diabetes Program Recommendations  AACE/ADA: New Consensus Statement on Inpatient Glycemic Control (2013)  Target Ranges:  Prepandial:   less than 140 mg/dL      Peak postprandial:   less than 180 mg/dL (1-2 hours)      Critically ill patients:  140 - 180 mg/dL   Reason for Assessment: DKA, Substance abuse, Intubated   Results for Matthew [name], Matthew [name] (MRN [id number]) as of 04/04/2014 09:34  Ref. Range 04/03/2014 13:34 04/03/2014 15:26 04/03/2014 19:11 04/03/2014 23:46 04/04/2014 03:10 04/04/2014 07:45  Glucose-Capillary Latest Range: 70-99 mg/dL 409131 (H) 811241 (H) 914273 (H) 173 (H) 120 (H) 316 (H)   Diabetes history:  DKA-Type 1 Diabetes Outpatient Diabetes medications: 70/30 mix 12 units tid with meals Current orders for Inpatient glycemic control:  Novolog moderate q 4 hours  It appears that glucose was greater than 1000 mg/dL on admit.  He was transitioned off the insulin drip however basal insulin not given.  CBG's back up to 316 mg/dL.  Called and discussed with RN.   MD please consider adding basal insulin such as Lantus 18 units daily since patient has history of Type 1 diabetes.  Also may consider reducing Novolog correction to sensitive.    Will follow. Thanks, Beryl MeagerJenny Derek Huneycutt, RN, BC-ADM Inpatient Diabetes Coordinator Pager [phone]

## 2014-04-05 LAB — MAGNESIUM: Magnesium: 2 mg/dL (ref 1.5–2.5)

## 2014-04-05 LAB — GLUCOSE, CAPILLARY
GLUCOSE-CAPILLARY: 174 mg/dL — AB (ref 70–99)
GLUCOSE-CAPILLARY: 88 mg/dL (ref 70–99)
Glucose-Capillary: 154 mg/dL — ABNORMAL HIGH (ref 70–99)
Glucose-Capillary: 79 mg/dL (ref 70–99)
Glucose-Capillary: 107 mg/dL — ABNORMAL HIGH (ref 70–99)
Glucose-Capillary: 142 mg/dL — ABNORMAL HIGH (ref 70–99)

## 2014-04-05 LAB — BASIC METABOLIC PANEL
Anion gap: 10 (ref 5–15)
BUN: 15 mg/dL (ref 6–23)
CHLORIDE: 112 meq/L (ref 96–112)
CO2: 23 meq/L (ref 19–32)
CREATININE: 1.25 mg/dL (ref 0.50–1.35)
Calcium: 8.6 mg/dL (ref 8.4–10.5)
GFR calc non Af Amer: 74 mL/min — ABNORMAL LOW (ref >90)
GFR, EST AFRICAN AMERICAN: 86 mL/min — AB (ref >90)
Glucose, Bld: 150 mg/dL — ABNORMAL HIGH (ref 70–99)
POTASSIUM: 3.7 meq/L (ref 3.7–5.3)
Sodium: 145 mEq/L (ref 137–147)

## 2014-04-05 LAB — PHOSPHORUS: PHOSPHORUS: 2.2 mg/dL — AB (ref 2.3–4.6)

## 2014-04-05 MED ORDER — HALOPERIDOL LACTATE 5 MG/ML IJ SOLN
INTRAMUSCULAR | Status: AC
  Filled 2014-04-05: qty 1

## 2014-04-05 MED ORDER — INSULIN GLARGINE 100 UNIT/ML ~~LOC~~ SOLN
15.0000 [IU] | Freq: Every day | SUBCUTANEOUS | Status: DC
  Administered 2014-04-06: 15 [IU] via SUBCUTANEOUS
  Filled 2014-04-05: qty 0.15

## 2014-04-05 MED ORDER — LORAZEPAM 2 MG/ML IJ SOLN
1.0000 mg | INTRAMUSCULAR | Status: DC | PRN
  Administered 2014-04-05: 4 mg via INTRAVENOUS

## 2014-04-05 MED ORDER — INSULIN GLARGINE 100 UNIT/ML ~~LOC~~ SOLN
3.0000 [IU] | Freq: Once | SUBCUTANEOUS | Status: AC
  Administered 2014-04-05: 3 [IU] via SUBCUTANEOUS
  Filled 2014-04-05: qty 0.03

## 2014-04-05 MED ORDER — HALOPERIDOL LACTATE 5 MG/ML IJ SOLN
5.0000 mg | Freq: Once | INTRAMUSCULAR | Status: AC
  Administered 2014-04-05: 5 mg via INTRAVENOUS

## 2014-04-05 MED ORDER — INSULIN GLARGINE 100 UNIT/ML ~~LOC~~ SOLN: 18.0000 [IU] | Freq: Every day | SUBCUTANEOUS | Status: DC

## 2014-04-05 MED ORDER — DEXTROSE-NACL 5-0.45 % IV SOLN
INTRAVENOUS | Status: DC
  Administered 2014-04-05: via INTRAVENOUS

## 2014-04-05 MED ORDER — LORAZEPAM 2 MG/ML IJ SOLN
INTRAMUSCULAR | Status: AC
  Filled 2014-04-05: qty 1

## 2014-04-05 MED ORDER — SODIUM CHLORIDE 0.45 % IV SOLN
INTRAVENOUS | Status: DC
Start: 2014-04-05 — End: 2014-04-05
  Administered 2014-04-05: 09:00:00 via INTRAVENOUS

## 2014-04-05 MED ORDER — INSULIN GLARGINE 100 UNIT/ML ~~LOC~~ SOLN: 20.0000 [IU] | Freq: Every day | SUBCUTANEOUS | Status: DC

## 2014-04-05 MED ORDER — SODIUM PHOSPHATE 3 MMOLE/ML IV SOLN
10.0000 mmol | Freq: Once | INTRAVENOUS | Status: AC
  Administered 2014-04-05: 10 mmol via INTRAVENOUS
  Filled 2014-04-05: qty 3.33

## 2014-04-05 NOTE — Progress Notes (Signed)
PULMONARY / CRITICAL CARE MEDICINE   Name: Matthew [name] MRN: [id number] DOB: [birth year]    ADMISSION DATE:  04/02/2014  PRIMARY SERVICE: PCCM  CHIEF COMPLAINT:  AMS  BRIEF PATIENT DESCRIPTION: 33 y/o man with DM1, hx of substance abuse p/w w/ unconsciousness, severe DKA after missing insulin, drinking, and taking cocaine.  SIGNIFICANT EVENTS: 10/03 Severe Acidosis in ED, intubated due to inability to protect airway 10/05 Extubated  STUDIES: 10/03 CT head >> no acute findings  SUBJECTIVE: Agitated last night s/p 27m Haldol and 459mAtivan  VITAL SIGNS: Temp:  [97.2 F (36.2 C)-99.9 F (37.7 C)] 97.8 F (36.6 C) (10/06 0700) Pulse Rate:  [67-110] 92 (10/06 0700) Resp:  [16-24] 16 (10/05 1255) BP: (113-145)/(56-93) 135/93 mmHg (10/06 0600) SpO2:  [98 %-100 %] 100 % (10/06 0700) FiO2 (%):  [40 %] 40 % (10/05 1131) Weight:  [177 lb 0.5 oz (80.3 kg)] 177 lb 0.5 oz (80.3 kg) (10/06 0400) VENTILATOR SETTINGS: Vent Mode:  [-] PSV;CPAP FiO2 (%):  [40 %] 40 % PEEP:  [5 cmH20] 5 cmH20 Pressure Support:  [5 cmH20-10 cmH20] 5 cmH20 INTAKE / OUTPUT: Intake/Output     10/05 0701 - 10/06 0700 10/06 0701 - 10/07 0700   P.O. 360    I.V. (mL/kg) 1838.6 (22.9)    NG/GT 30    IV Piggyback 50    Total Intake(mL/kg) 2278.6 (28.4)    Urine (mL/kg/hr) 740 (0.4)    Emesis/NG output 275 (0.1)    Total Output 1015     Net +1263.6            PHYSICAL EXAMINATION: General: no distress HEENT: PERRL Cardiovascular:  Tachycardia, regular rhythm, no s1/2 Lungs: no wheeze Abdomen:  Soft, non tender Musculoskeletal: no edema Skin: no rashes Neuro: neg Babinski; patella/achilles reflex 1+; grunts in response to voice; opens eyes to sternal rub   LABS:  CBC  Recent Labs Lab 04/02/14 1826 04/03/14 0040 04/04/14 0502  WBC 24.0* 23.8* 11.3*  HGB 15.7 14.8 13.6  HCT 51.1 43.4 39.0  PLT 369 325 275   Coag's  Recent Labs Lab 04/02/14 1826  INR 1.34   BMET  Recent Labs Lab  04/04/14 0502 04/04/14 0825 04/05/14 0513  NA 142 140 145  K 4.5 5.0 3.7  CL 112 108 112  CO2 18* 18* 23  BUN _0 CREATININE 1.60* 1.74* 1.25  GLUCOSE 165* 371* 150*   Electrolytes  Recent Labs Lab 04/03/14 1108  04/03/14 1930  04/03/14 2239  04/04/14 0502 04/04/14 0825 04/05/14 0513  CALCIUM  --   < >  --   < >  --   < > 8.2* 8.6 8.6  MG  --   --  2.6*  --   --   --  2.5  --  2.0  PHOS 0.6*  --   --   --  2.6  --   --   --  2.2*  < > = values in this interval not displayed. Sepsis Markers  Recent Labs Lab 04/03/14 0040 04/03/14 0445 04/03/14 0710  LATICACIDVEN 2.1 2.3* 2.3*   ABG  Recent Labs Lab 04/02/14 1952 04/02/14 2144 04/04/14 1207  PHART 7.053* 6.999* 7.321*  PCO2ART 16.0* 29.4* 43.3  PO2ART 45.0* 106.0* 190.0*   Liver Enzymes  Recent Labs Lab 04/02/14 1826  AST 47*  ALT 23  ALKPHOS 141*  BILITOT 0.3  ALBUMIN 3.8   Glucose  Recent Labs Lab 04/04/14 0745 04/04/14 1242 04/04/14 1543 04/04/14  2054 04/05/14 0027 04/05/14 0357  GLUCAP 316* 261* 163* 77 174* 154*    Imaging Dg Chest Port 1 View  04/04/2014   CLINICAL DATA:  33 year old male with shortness of breath and atelectasis described on Prior chest x-ray. The patient is intubated. Evaluate for position of endotracheal tube.  EXAM: PORTABLE CHEST - 1 VIEW  COMPARISON:  Prior chest x-ray 04/03/2014  FINDINGS: The patient remains intubated. The endotracheal tube remains in good position 3.7 cm above the carina. A nasogastric tube is present with the tip overlying the greater curvature of the stomach. Stable cardiac and mediastinal contours. Borderline cardiomegaly is likely exaggerated by low inspiratory volumes. Inspiratory volumes remain low with mild bibasilar atelectasis. No new infiltrate, pleural effusion or pneumothorax.  IMPRESSION: 1. Persistent low inspiratory volumes with bibasilar subsegmental atelectasis. 2. Stable and satisfactory support apparatus.   Electronically  Signed   By: Jacqulynn Cadet M.D.   On: 04/04/2014 07:40     ASSESSMENT / PLAN:  PULMONARY A: Acute respiratory failure 2nd to altered mental status and acidosis. Tobacco abuse. P:   S/p Extubation 10/5 Push pulm hygiene  CARDIOVASCULAR A: Sinus tachycardia 2nd to hypovolemia. P:   Monitor hemodynamics NS @ 50 overnight --> switch to 1/2NS today  RENAL Lab Results  Component Value Date   CREATININE 1.25 04/05/2014   CREATININE 1.74* 04/04/2014   CREATININE 1.60* 04/04/2014    Recent Labs Lab 04/04/14 0502 04/04/14 0825 04/05/14 0513  K 4.5 5.0 3.7    A: Acute Kidney Injury 2nd to hypovolemia. Hyperkalemia from DKA >> resolved. P:   Monitor renal fx, urine outpt (UOP 0.4) Replete phos F/u electrolytes Will pull foley as able   GASTROINTESTINAL A: Nutrition. P:   NPO for now, will attempt to advance diet mentation allows Pepcid for SUP  HEMATOLOGIC A: Leukocytosis likely from acute stress. P:   F/u CBC SQ heparin for DVT prevention  INFECTIOUS Blood 10/03 >>NGTD A: No evidence for infection; never met SIRS criteria P:   Monitor clinically  ENDOCRINE A: DKA, resolved P:   Insulin gtt stopped 10/4, now on SSI (required 17 U novolog) Lantus 15u > increase to 18u on 6/10 AG 10 on 10/6 Will need DM education  Will monitor insulin requirements as pt starts to take PO  NEUROLOGIC A: AMS due to metabolic encephalopathy and cocaine intoxication P:   Required both haldol and ativan 10/5 pm Unable to assess CN this morning 2/2 mentation Trend neuro exams Potentially related to sedating medication-> will reassess once pt more awake  May need further w/up if not improving through the day 10/6  Today's Summary: pending improvement in mentation further neurologic testing, titration of insulin regimen, DM teaching   Bernadene Bell, MD Family Medicine PGY-2 Please page or call with questions  Baltazar Apo, MD, PhD 04/05/2014, 11:35 AM [place]  [facility] [phone] or if no answer [phone]

## 2014-04-05 NOTE — Care Management Note (Signed)
    Page 1 of 1   04/05/2014     11:08:44 AM CARE MANAGEMENT NOTE 04/05/2014  Patient:  Matthew [name],Matthew [name]   Account Number:  [id number]  Date Initiated:  04/05/2014  Documentation initiated by:  Junius CreamerWELL,DEBBIE  Subjective/Objective Assessment:   ad, w dka-vent     Action/Plan:   lives w fam   Anticipated DC Date:     Anticipated DC Plan:    In-house referral  Clinical Social Worker         Choice offered to / List presented to:             Status of service:   Medicare Important Message given?   (If response is "NO", the following Medicare IM given date fields will be blank) Date Medicare IM given:   Medicare IM given by:   Date Additional Medicare IM given:   Additional Medicare IM given by:    Discharge Disposition:    Per UR Regulation:  Reviewed for med. necessity/level of care/duration of stay  If discussed at Long Length of Stay Meetings, dates discussed:    Comments:  10/6 1107 debbie Jesusita Jocelyn rn,bsn sw ref for substance abuse co.

## 2014-04-05 NOTE — Progress Notes (Signed)
Patient extremely agitated and resistant to following request to get back in bed.  5 mg Haldol and 4 mg Ativan given, per Dr. Evlyn CourierSood's orders.  Patient finally agreeable to get back in bed and rest.  Safety measures maintained.  Will continue to assess.  Keitha ButteSMITH, Shylo Zamor A, RN

## 2014-04-05 NOTE — Progress Notes (Signed)
Patient referred to CSW to assess for substance abuse (use of cocaine and ETOH).  Patient is currently oriented to person only and is not appropriate at this time for CSW  Intervention. Unable to assess or ascertain patient's willingness to accept treatment or services.   CSW will sign off for now.  Please re-order when patient is more stable for assessment.  Thank you.  Lorri Frederickonna T. Jaci LazierCrowder, KentuckyLCSW [phone]

## 2014-04-05 NOTE — Progress Notes (Signed)
eLink Physician-Brief Progress Note Patient Name: Matthew [name] DOB: [birth year] MRN: [id number]   Date of Service  04/05/2014  HPI/Events of Note  Agitated  eICU Interventions  Haldol 5 mg IV x one     Intervention Category Major Interventions: Other:  [name] 04/05/2014, 3:09 AM

## 2014-04-05 NOTE — Progress Notes (Signed)
eLink Physician-Brief Progress Note Patient Name: Matthew HauJovie Neeson DOB: Mar 30, 1981 MRN: [id number]   Date of Service  04/05/2014  HPI/Events of Note  CBG (last 3)   Recent Labs  04/05/14 1136 04/05/14 1621 04/05/14 2020  GLUCAP 107* 142* 88    Relatively low blood sugar.  Pt is NPO.   eICU Interventions  Will change IV fluid to D5 1/2 NS at 50 ml/hr.     Intervention Category Major Interventions: Other:  Antolin Belsito 04/05/2014, 11:49 PM

## 2014-04-06 DIAGNOSIS — J9601 Acute respiratory failure with hypoxia: Secondary | ICD-10-CM | POA: Diagnosis present

## 2014-04-06 DIAGNOSIS — F1410 Cocaine abuse, uncomplicated: Secondary | ICD-10-CM | POA: Diagnosis present

## 2014-04-06 DIAGNOSIS — G928 Other toxic encephalopathy: Secondary | ICD-10-CM | POA: Diagnosis present

## 2014-04-06 DIAGNOSIS — G92 Toxic encephalopathy: Secondary | ICD-10-CM | POA: Diagnosis present

## 2014-04-06 DIAGNOSIS — Z720 Tobacco use: Secondary | ICD-10-CM | POA: Diagnosis present

## 2014-04-06 LAB — BASIC METABOLIC PANEL
Anion gap: 12 (ref 5–15)
BUN: 12 mg/dL (ref 6–23)
CHLORIDE: 109 meq/L (ref 96–112)
CO2: 24 meq/L (ref 19–32)
Calcium: 8.3 mg/dL — ABNORMAL LOW (ref 8.4–10.5)
Creatinine, Ser: 0.97 mg/dL (ref 0.50–1.35)
GFR calc Af Amer: >90 mL/min (ref >90)
GFR calc non Af Amer: >90 mL/min (ref >90)
Glucose, Bld: 87 mg/dL (ref 70–99)
Potassium: 3.6 mEq/L — ABNORMAL LOW (ref 3.7–5.3)
SODIUM: 145 meq/L (ref 137–147)

## 2014-04-06 LAB — GLUCOSE, CAPILLARY
GLUCOSE-CAPILLARY: 171 mg/dL — AB (ref 70–99)
GLUCOSE-CAPILLARY: 243 mg/dL — AB (ref 70–99)
GLUCOSE-CAPILLARY: 77 mg/dL (ref 70–99)
Glucose-Capillary: 87 mg/dL (ref 70–99)

## 2014-04-06 NOTE — Discharge Summary (Signed)
PULMONARY / CRITICAL CARE MEDICINE Discharge Summary   Name: Paarth Cropper MRN: [id number] DOB: June 25, 1981    ADMISSION DATE:  04/02/2014 DISCHARGE DATE:  04/06/2014  PRIMARY SERVICE: PCCM  CHIEF COMPLAINT:  AMS, DKA  FINAL DISCHARGE DIAGNOSIS:   SECONDARY DISCHARGE DIAGNOSES:    BRIEF HOSPITAL COURSE: 33 y/o man with DM1, hx of EtOH and cocaine abuse. He was brought comatose to the [facility] ED on 04/02/14. He had reportedly had a drinking binge and was without his insulin on the day prior to presentation, was found unresponsive 12 hours before presentation but was presumed to be sleeping. When his girlfriend returned after work he was in the same position and could not be waked. EMS was activated. At [facility] he was found to have a severe DKA and lactic acidosis. He was intubated for airway protection and aggressively treated for his DKA. His head and c-spine CT scans were negative. He was able to be extubated 10/5. He was delirious for approximately one day, but slowly regained his MS and was interacting normally on 10/7/ His electrolytes and glucose were stable at that time. He was discharged in stable condition with plan to resume his home insulin regimen of 12u 70/30 insulin tid ac.   SIGNIFICANT EVENTS: 10/03 Severe Acidosis in ED, intubated due to inability to protect airway 10/05 Extubated 10/06 agitation/lethargy 10/07 improved MS  STUDIES: 10/03 CT head >> no acute findings  SUBJECTIVE:  Pt awake, interacting normally but angry Wants to go home asap   VITAL SIGNS: Temp:  [97.7 F (36.5 C)-99.2 F (37.3 C)] 98.2 F (36.8 C) (10/07 1309) Pulse Rate:  [70-100] 70 (10/07 0900) BP: (127-159)/(82-100) 151/84 mmHg (10/07 0900) SpO2:  [99 %-100 %] 100 % (10/07 0900) Weight:  [77.9 kg (171 lb 11.8 oz)] 77.9 kg (171 lb 11.8 oz) (10/07 0330) VENTILATOR SETTINGS:   INTAKE / OUTPUT: Intake/Output     10/06 0701 - 10/07 0700 10/07 0701 - 10/08 0700   P.O. 240    I.V. (mL/kg) 1109.2  (14.2) 200 (2.6)   NG/GT     IV Piggyback 394    Total Intake(mL/kg) 1743.2 (22.4) 200 (2.6)   Urine (mL/kg/hr) 755 (0.4)    Emesis/NG output     Total Output 755     Net +988.2 +200          PHYSICAL EXAMINATION: General: no distress HEENT: PERRL Cardiovascular:  Tachycardia, regular rhythm, no s1/2 Lungs: no wheeze Abdomen:  Soft, non tender Musculoskeletal: no edema Skin: no rashes Neuro: awake, interacting, has ambulated, non-focal  LABS:  CBC  Recent Labs Lab 04/02/14 1826 04/03/14 0040 04/04/14 0502  WBC 24.0* 23.8* 11.3*  HGB 15.7 14.8 13.6  HCT 51.1 43.4 39.0  PLT 369 325 275   Coag's  Recent Labs Lab 04/02/14 1826  INR 1.34   BMET  Recent Labs Lab 04/04/14 0825 04/05/14 0513 04/06/14 0220  NA 140 145 145  K 5.0 3.7 3.6*  CL 108 112 109  CO2 18* 23 24  BUN '20 15 12  ' CREATININE 1.74* 1.25 0.97  GLUCOSE 371* 150* 87   Electrolytes  Recent Labs Lab 04/03/14 1108  04/03/14 1930  04/03/14 2239  04/04/14 0502 04/04/14 0825 04/05/14 0513 04/06/14 0220  CALCIUM  --   < >  --   < >  --   < > 8.2* 8.6 8.6 8.3*  MG  --   --  2.6*  --   --   --  2.5  --  2.0  --   PHOS 0.6*  --   --   --  2.6  --   --   --  2.2*  --   < > = values in this interval not displayed. Sepsis Markers  Recent Labs Lab 04/03/14 0040 04/03/14 0445 04/03/14 0710  LATICACIDVEN 2.1 2.3* 2.3*   ABG  Recent Labs Lab 04/02/14 1952 04/02/14 2144 04/04/14 1207  PHART 7.053* 6.999* 7.321*  PCO2ART 16.0* 29.4* 43.3  PO2ART 45.0* 106.0* 190.0*   Liver Enzymes  Recent Labs Lab 04/02/14 1826  AST 47*  ALT 23  ALKPHOS 141*  BILITOT 0.3  ALBUMIN 3.8   Glucose  Recent Labs Lab 04/05/14 1136 04/05/14 1621 04/05/14 2020 04/05/14 2337 04/06/14 0328 04/06/14 0845  GLUCAP 107* 142* 88 77 87 171*    Imaging No results found.   DISCHARGE PLANS BY SYSTEM / PROBLEM:  PULMONARY A: Acute respiratory failure 2nd to altered mental status and acidosis.  S/p Extubation 10/5 Tobacco abuse. P:   Push pulm hygiene Wants a cigarette > add nicotine patch if he requests ENCOURAGED CESSATION  CARDIOVASCULAR A: Sinus tachycardia 2nd to hypovolemia. Resolved P:    RENAL Lab Results  Component Value Date   CREATININE 0.97 04/06/2014   CREATININE 1.25 04/05/2014   CREATININE 1.74* 04/04/2014    Recent Labs Lab 04/04/14 0825 04/05/14 0513 04/06/14 0220  K 5.0 3.7 3.6*    A: Acute Kidney Injury 2nd to hypovolemia. Resolved Hyperkalemia from DKA >> resolved. P:   No intervention planned.   GASTROINTESTINAL A: Nutrition. P:   Started diabetic diet  HEMATOLOGIC A: Leukocytosis likely from acute stress. Resolving P:   No outpatient abx necessary  INFECTIOUS Blood 10/03 >>NGTD A: No evidence for infection; never met SIRS criteria P:   No outpatient abx necessary  ENDOCRINE A: DKA, resolved P:   Transition back to 70/30 insulin 12u tid ac, discharge on this dose.  Will need DM education, but suspect he will leave the hospital AMA before this can happen. Will encourage to follow this up as an outpt.   NEUROLOGIC A: AMS due to metabolic encephalopathy and cocaine intoxication, improved P:    Today's Summary: Pt is insisting on going home. While this is not optimal since he has not been transitioned to his home insulin regimen yet, there does not seem to be an acute issue keeping him here. I will discharge him today 10/7.  I have advised him to not return to work until Friday 04/08/14.    Baltazar Apo, MD, PhD 04/06/2014, 1:09 PM [facility] [phone] or if no answer [phone]

## 2014-04-06 NOTE — Progress Notes (Addendum)
Patient will intermittently answer questions. Answers to his name. Stated that he was in the hospital. Stated that he was comfortable and in no pain. Will follow some commands. Moves all extremities  At 0600, patient demanding to talk to a doctor and wants to leave hospital. I explained to him that a doctor will be in to see him in a few hours. Patient asked why he was still here. I explained that his noncompliance with our assessments and refusing to answer questions makes the doctors think there is something else wrong. If he wants to get out of here he needs to wake up and talk to the doctors and answer our questions. Patient then demanded a soda to drink. ELink notified and changed diet from NPO to carb modified diet. Patient is able to swallow without difficulties. Given diet soda.

## 2014-04-06 NOTE — Discharge Instructions (Signed)
Diabetic Ketoacidosis °Diabetic ketoacidosis (DKA) is a life-threatening complication of type 1 diabetes. It must be quickly recognized and treated. Treatment requires hospitalization. °CAUSES  °When there is no insulin in the body, glucose (sugar) cannot be used, and the body breaks down fat for energy. When fat breaks down, acids (ketones) build up in the blood. Very high levels of glucose and high levels of acids lead to severe loss of body fluids (dehydration) and other dangerous chemical changes. This stresses your vital organs and can cause coma or death. °SIGNS AND SYMPTOMS  °· Tiredness (fatigue). °· Weight loss. °· Excessive thirst. °· Ketones in your urine. °· Light-headedness. °· Fruity or sweet smelling breath. °· Excessive urination. °· Visual changes. °· Confusion or irritability. °· Nausea or vomiting. °· Rapid breathing. °· Stomachache or abdominal pain. °DIAGNOSIS  °Your health care provider will diagnose DKA based on your history, physical exam, and blood tests. The health care provider will check to see if you have another illness that caused you to go into DKA. Most of this will be done quickly in an emergency room. °TREATMENT  °· Fluid replacement to correct dehydration. °· Insulin. °· Correction of electrolytes, such as potassium and sodium. °· Antibiotic medicines. °PREVENTION °· Always take your insulin. Do not skip your insulin injections. °· If you are sick, treat yourself quickly. Your body often needs more insulin to fight the illness. °· Check your blood glucose regularly. °· Check urine ketones if your blood glucose is greater than 240 milligrams per deciliter (mg/dL). °· Do not use outdated (expired) insulin. °· If your blood glucose is high, drink plenty of fluids. This helps flush out ketones. °HOME CARE INSTRUCTIONS  °· If you are sick, follow the advice of your health care provider. °· To prevent dehydration, drink enough water and fluids to keep your urine clear or pale  yellow. °¨ If you cannot eat, alternate between drinking fluids with sugar (soda, juices, flavored gelatin) and salty fluids (broth, bouillon). °¨ If you can eat, follow your usual diet and drink sugar-free liquids (water, diet drinks). °· Always take your usual dose of insulin. If you cannot eat or if your glucose is getting too low, call your health care provider for further instructions. °· Continue to monitor your blood or urine ketones every 3-4 hours around the clock. Set your alarm clock or have someone wake you up. If you are too sick, have someone test it for you. °· Rest and avoid exercise. °SEEK MEDICAL CARE IF:  °· You have a fever. °· You have ketones in your urine, or your blood glucose is higher than a level your health care provider suggests. You may need extra insulin. Call your health care provider if you need advice on adjusting your insulin. °· You cannot drink at least a tablespoon (15 mL) of fluid every 15-20 minutes. °· You have been vomiting for more than 2 hours. °· You have symptoms of DKA: °¨ Fruity smelling breath. °¨ Breathing faster or slower. °¨ Becoming very sleepy. °SEEK IMMEDIATE MEDICAL CARE IF:  °· You have signs of dehydration: °¨ Decreased urination. °¨ Increased thirst. °¨ Dry skin and mouth. °¨ Light-headedness. °· Your blood glucose is very high (as advised by your health care provider) twice in a row. °· You faint. °· You have chest pain or trouble breathing. °· You have a sudden, severe headache. °· You have sudden weakness in one arm or one leg. °· You have sudden trouble speaking or swallowing. °· You   have vomiting or diarrhea that is getting worse after 3 hours.  You have abdominal pain. MAKE SURE YOU:   Understand these instructions.  Will watch your condition.  Will get help right away if you are not doing well or get worse. Document Released: 06/14/2000 Document Revised: 06/22/2013 Document Reviewed: 12/21/2008 [facility] Patient Information 2015 [employer]  [place]. This information is not intended to replace advice given to you by your health care provider. Make sure you discuss any questions you have with your health care provider.  Patient [facility] He may return to work on Friday 04/08/14.

## 2014-04-06 NOTE — Progress Notes (Signed)
Noted orders to DC patient home.DC instructions given and patient verbalized understanding.Escorted to DC lounge without any complains.

## 2014-04-09 LAB — CULTURE, BLOOD (ROUTINE X 2)
CULTURE: NO GROWTH
Culture: NO GROWTH

## 2014-04-11 ENCOUNTER — Encounter (HOSPITAL_COMMUNITY): Payer: Self-pay | Admitting: Emergency Medicine

## 2014-10-25 ENCOUNTER — Emergency Department (HOSPITAL_COMMUNITY)
Admission: EM | Admit: 2014-10-25 | Discharge: 2014-10-25 | Disposition: A | Discharge status: Home / Self Care | Payer: Self-pay | Attending: Emergency Medicine | Admitting: Emergency Medicine

## 2014-10-25 ENCOUNTER — Encounter (HOSPITAL_COMMUNITY): Payer: Self-pay | Admitting: Emergency Medicine

## 2014-10-25 DIAGNOSIS — Z794 Long term (current) use of insulin: Secondary | ICD-10-CM | POA: Insufficient documentation

## 2014-10-25 DIAGNOSIS — E109 Type 1 diabetes mellitus without complications: Secondary | ICD-10-CM | POA: Insufficient documentation

## 2014-10-25 DIAGNOSIS — Z720 Tobacco use: Secondary | ICD-10-CM | POA: Insufficient documentation

## 2014-10-25 DIAGNOSIS — F1010 Alcohol abuse, uncomplicated: Secondary | ICD-10-CM | POA: Insufficient documentation

## 2014-10-25 DIAGNOSIS — R112 Nausea with vomiting, unspecified: Secondary | ICD-10-CM

## 2014-10-25 LAB — COMPREHENSIVE METABOLIC PANEL
ALT: 12 U/L (ref 0–53)
AST: 31 U/L (ref 0–37)
Albumin: 3.3 g/dL — ABNORMAL LOW (ref 3.5–5.2)
Alkaline Phosphatase: 44 U/L (ref 39–117)
Anion gap: 11 (ref 5–15)
BILIRUBIN TOTAL: 1.1 mg/dL (ref 0.3–1.2)
BUN: 7 mg/dL (ref 6–23)
CO2: 23 mmol/L (ref 19–32)
CREATININE: 1.16 mg/dL (ref 0.50–1.35)
Calcium: 8.4 mg/dL (ref 8.4–10.5)
Chloride: 103 mmol/L (ref 96–112)
GFR calc Af Amer: >90 mL/min (ref >90)
GFR calc non Af Amer: 81 mL/min — ABNORMAL LOW (ref >90)
GLUCOSE: 119 mg/dL — AB (ref 70–99)
Potassium: 4.7 mmol/L (ref 3.5–5.1)
Sodium: 137 mmol/L (ref 135–145)
TOTAL PROTEIN: 5.7 g/dL — AB (ref 6.0–8.3)

## 2014-10-25 LAB — URINALYSIS, ROUTINE W REFLEX MICROSCOPIC
Bilirubin Urine: NEGATIVE
GLUCOSE, UA: NEGATIVE mg/dL
HGB URINE DIPSTICK: NEGATIVE
KETONES UR: NEGATIVE mg/dL
LEUKOCYTES UA: NEGATIVE
NITRITE: NEGATIVE
PH: 7 (ref 5.0–8.0)
PROTEIN: NEGATIVE mg/dL
Specific Gravity, Urine: 1.024 (ref 1.005–1.030)
UROBILINOGEN UA: 0.2 mg/dL (ref 0.0–1.0)

## 2014-10-25 LAB — CBC WITH DIFFERENTIAL/PLATELET
BASOS ABS: 0 10*3/uL (ref 0.0–0.1)
Basophils Relative: 0 % (ref 0–1)
Eosinophils Absolute: 0 10*3/uL (ref 0.0–0.7)
Eosinophils Relative: 0 % (ref 0–5)
HEMATOCRIT: 43.6 % (ref 39.0–52.0)
HEMOGLOBIN: 15.2 g/dL (ref 13.0–17.0)
LYMPHS ABS: 1.4 10*3/uL (ref 0.7–4.0)
LYMPHS PCT: 22 % (ref 12–46)
MCH: 28.1 pg (ref 26.0–34.0)
MCHC: 34.9 g/dL (ref 30.0–36.0)
MCV: 80.6 fL (ref 78.0–100.0)
MONOS PCT: 8 % (ref 3–12)
Monocytes Absolute: 0.5 10*3/uL (ref 0.1–1.0)
NEUTROS PCT: 70 % (ref 43–77)
Neutro Abs: 4.6 10*3/uL (ref 1.7–7.7)
Platelets: 302 10*3/uL (ref 150–400)
RBC: 5.41 MIL/uL (ref 4.22–5.81)
RDW: 15.1 % (ref 11.5–15.5)
WBC: 6.5 10*3/uL (ref 4.0–10.5)

## 2014-10-25 LAB — I-STAT VENOUS BLOOD GAS, ED
Acid-Base Excess: 1 mmol/L (ref 0.0–2.0)
BICARBONATE: 26 meq/L — AB (ref 20.0–24.0)
O2 SAT: 99 %
PCO2 VEN: 39.9 mmHg — AB (ref 45.0–50.0)
TCO2: 27 mmol/L (ref 0–100)
pH, Ven: 7.422 — ABNORMAL HIGH (ref 7.250–7.300)
pO2, Ven: 127 mmHg — ABNORMAL HIGH (ref 30.0–45.0)

## 2014-10-25 LAB — LIPASE, BLOOD: LIPASE: 19 U/L (ref 11–59)

## 2014-10-25 LAB — CBG MONITORING, ED: GLUCOSE-CAPILLARY: 140 mg/dL — AB (ref 70–99)

## 2014-10-25 MED ORDER — ONDANSETRON HCL 4 MG PO TABS: 4.0000 mg | ORAL_TABLET | Freq: Four times a day (QID) | ORAL | Status: DC

## 2014-10-25 MED ORDER — ONDANSETRON 4 MG PO TBDP
8.0000 mg | ORAL_TABLET | Freq: Once | ORAL | Status: AC
  Administered 2014-10-25: 8 mg via ORAL
  Filled 2014-10-25: qty 2

## 2014-10-25 MED ORDER — ONDANSETRON HCL 4 MG/2ML IJ SOLN
4.0000 mg | INTRAMUSCULAR | Status: DC
  Filled 2014-10-25: qty 2

## 2014-10-25 MED ORDER — INSULIN ASPART PROT & ASPART (70-30 MIX) 100 UNIT/ML ~~LOC~~ SUSP: 12.0000 [IU] | Freq: Three times a day (TID) | SUBCUTANEOUS | Status: DC

## 2014-10-25 MED ORDER — SODIUM CHLORIDE 0.9 % IV BOLUS (SEPSIS): 1000.0000 mL | INTRAVENOUS | Status: DC

## 2014-10-25 NOTE — ED Notes (Signed)
Pt CBG WNL, Pt has some AMS c/o headache and weakness. Stated that he should not have been drinking. Stated that he should not be here that he is supposed to be in court. Friend at bedside stated that when he normally acts like this his blood sugar is low.

## 2014-10-25 NOTE — ED Provider Notes (Signed)
CSN: [id number]     Arrival date & time 10/25/14  1100 History   First MD Initiated Contact with Patient 10/25/14 1132     No chief complaint on file.  (Consider location/radiation/quality/duration/timing/severity/associated sxs/prior Treatment) HPI   Matthew [name] is a 34 yo male with PMH: DM type 1, presenting with report of nausea and vomiting.  He states he has been binge drinking since last week.  He states daily for the last 6 days, he drank a combination of 1/2 - 1 case of beer and a 1/5 of liquor.  He began vomiting yesterday multiple times.  When asked how many he replies, "lots".  He reports the emesis is mostly clear and no bloody emesis. He reports he has taken his insulin today but unsure if he has taken it other days because he take sit whenever he can borrow it from other people. He denies any chills, chest pain, shortness of breath, or diarrhea.    Past Medical History  Diagnosis Date  . DM type 1 (diabetes mellitus, type 1) 2010    diagnosed in ED at [facility], started on insulin  . Diabetes mellitus without complication    History reviewed. No pertinent past surgical history. Family History  Problem Relation Age of Onset  . Hypertension Father    History  Substance Use Topics  . Smoking status: Current Every Day Smoker -- 1.00 packs/day for 15 years    Types: Cigarettes  . Smokeless tobacco: Not on file  . Alcohol Use: Yes     Comment: occasionally - beer     Review of Systems  Unable to perform ROS Constitutional:       Answers "Don't Know" to most ROS questions    Allergies  Ibuprofen  Home Medications   Prior to Admission medications   Medication Sig Start Date End Date Taking? Authorizing Provider  insulin aspart protamine- aspart (NOVOLOG MIX 70/30) (70-30) 100 UNIT/ML injection Inject 20-25 Units into the skin 2 (two) times daily with a meal. 20 units every morning and 21 units at night 05/03/11 05/02/12  Amanjot Sidhu, MD  insulin aspart  protamine- aspart (NOVOLOG MIX 70/30) (70-30) 100 UNIT/ML injection Inject 12 Units into the skin 3 (three) times daily after meals.    Historical Provider, MD  insulin NPH-regular Human (NOVOLIN 70/30) (70-30) 100 UNIT/ML injection Inject into the skin. 20 units every morning and 21 units at bedtime.    Historical Provider, MD   BP 173/109 mmHg  Pulse 99  Temp(Src) 99.1 F (37.3 C) (Oral)  Resp 20  Ht  (1.626 m)  Wt 185 lb (83.915 kg)  BMI 31.74 kg/m2  SpO2 99% Physical Exam  Constitutional: He is oriented to person, place, and time. He appears well-developed and well-nourished. No distress.  HENT:  Head: Normocephalic and atraumatic.  Mouth/Throat: Oropharynx is clear and moist.  Eyes: Conjunctivae are normal. Pupils are equal, round, and reactive to light.  Neck: Neck supple.  Cardiovascular: Normal rate, regular rhythm and intact distal pulses.   Pulmonary/Chest: Effort normal and breath sounds normal. No respiratory distress.  Abdominal: Soft. He exhibits no distension and no mass. There is no tenderness. There is no rigidity, no rebound, no guarding, no CVA tenderness, no tenderness at McBurney's point and negative Murphy's sign.  Musculoskeletal: He exhibits no tenderness.  Lymphadenopathy:    He has no cervical adenopathy.  Neurological: He is alert and oriented to person, place, and time. He has normal strength. No cranial nerve  deficit or sensory deficit. GCS eye subscore is 4. GCS verbal subscore is 5. GCS motor subscore is 6.  Initially slow to answer questions,  Skin: Skin is warm and dry. No rash noted. He is not diaphoretic.  Psychiatric: He has a normal mood and affect. His speech is delayed. He is noncommunicative.  Nursing note and vitals reviewed.   ED Course  Procedures (including critical care time) Labs Review Labs Reviewed  COMPREHENSIVE METABOLIC PANEL - Abnormal; Notable for the following:    Glucose, Bld 119 (*)    Total Protein 5.7 (*)    Albumin  3.3 (*)    GFR calc non Af Amer 81 (*)    All other components within normal limits  URINALYSIS, ROUTINE W REFLEX MICROSCOPIC - Abnormal; Notable for the following:    Color, Urine AMBER (*)    All other components within normal limits  CBG MONITORING, ED - Abnormal; Notable for the following:    Glucose-Capillary 140 (*)    All other components within normal limits  I-STAT VENOUS BLOOD GAS, ED - Abnormal; Notable for the following:    pH, Ven 7.422 (*)    pCO2, Ven 39.9 (*)    pO2, Ven 127.0 (*)    Bicarbonate 26.0 (*)    All other components within normal limits  CBC WITH DIFFERENTIAL/PLATELET  LIPASE, BLOOD    Imaging Review No results found.   EKG Interpretation   Date/Time:  Tuesday October 25 2014 11:14:14 EDT Ventricular Rate:  107 PR Interval:  144 QRS Duration: 71 QT Interval:  325 QTC Calculation: 434 R Axis:   82 Text Interpretation:  Sinus tachycardia Inferior infarct, acute (LCx)  Borderline ST elevation, anterior leads Lateral leads are also involved No  significant change since last tracing Early repolarization Confirmed by  ZACKOWSKI  MD, SCOTT [phone]([id number]) on 10/25/2014 11:17:43 AM      MDM   Final diagnoses:  Non-intractable vomiting with nausea, vomiting of unspecified type  Alcohol abuse   34 yo with reported decreased mental status with history of DKA. Pt reports significant alcohol intake over the last week and subsequent vomiting. His glucose here is normal, he has no ketones in his urine and his pH is not acidotic. Plan to rehydrate with IVF, but RN unable to obtain IV access. His alertness and verbal responses improved after ODT zofran and oral re-hydration. Discussed reducing alcohol intake and being adherent to his diabetes regimen. Prescription for 70/30 insulin provided.  Pt is well-appearing, in no acute distress and vital signs reviewed and not concerning.  He appears safe to be discharged.  Discharge include resources to follow-up with their PCP.   Return precautions provided. Pt aware of plan and in agreement.    Filed Vitals:   10/25/14 1124 10/25/14 1239 10/25/14 1330  BP: 173/109 153/103 143/83  Pulse: 99 88 94  Temp: 99.1 F (37.3 C)    TempSrc: Oral    Resp: 20 22 21   Height: 5\' 4"  (1.626 m)    Weight: 185 lb (83.915 kg)    SpO2: 99% 99% 98%   Meds given in ED:  Medications  ondansetron (ZOFRAN-ODT) disintegrating tablet 8 mg (8 mg Oral Given 10/25/14 1232)    Discharge Medication List as of 10/25/2014  1:44 PM    START taking these medications   Details  ondansetron (ZOFRAN) 4 MG tablet Take 1 tablet (4 mg total) by mouth every 6 (six) hours., Starting 10/25/2014, Until Discontinued, Print  Harle Battiest, NP 10/26/14 1352  Vanetta Mulders, MD 10/27/14 [phone]

## 2014-10-25 NOTE — ED Notes (Signed)
Patient ambulated around nurses station without assitance and without difficulty.  Patient sitting on side of bed drinking ginger ale.

## 2014-10-25 NOTE — Discharge Instructions (Signed)
Please follow directions provided. Is very important free to establish care with a regular doctor for further management of your diabetes and recheck of your symptoms. You may use the referral given or the resource guide below to help you find a primary care doctor. You been provided with a prescription of your insulin 70/30. Please take as directed 3 times a day before meals. Be sure to drink plenty of fluids by mouth to stay well hydrated. Eat a balanced diet and avoid alcohol intake. Don't hesitate to return for any new, worsening, or concerning symptoms.   SEEK MEDICAL CARE IF:  You are unable to eat food or drink fluids for more than 6 hours.  You have nausea and vomiting for more than 6 hours.  Your blood glucose level is over 240 mg/dL.  There is a change in mental status.  You develop an additional serious illness.  You have diarrhea for more than 6 hours.  You have been sick or have had a fever for a couple of days and are not getting better.  You have pain during any physical activity.   Emergency Department Resource Guide 1) Find a Doctor and Pay Out of Pocket Although you won't have to find out who is covered by your insurance plan, it is a good idea to ask around and get recommendations. You will then need to call the office and see if the doctor you have chosen will accept you as a new patient and what types of options they offer for patients who are self-pay. Some doctors offer discounts or will set up payment plans for their patients who do not have insurance, but you will need to ask so you aren't surprised when you get to your appointment.  2) Contact Your Local Health Department Not all health departments have doctors that can see patients for sick visits, but many do, so it is worth a call to see if yours does. If you don't know where your local health department is, you can check in your phone book. The CDC also has a tool to help you locate your state's health department, and  many state websites also have listings of all of their local health departments.  3) Find a Walk-in Clinic If your illness is not likely to be very severe or complicated, you may want to try a walk in clinic. These are popping up all over the country in pharmacies, drugstores, and shopping centers. They're usually staffed by nurse practitioners or physician assistants that have been trained to treat common illnesses and complaints. They're usually fairly quick and inexpensive. However, if you have serious medical issues or chronic medical problems, these are probably not your best option.  No Primary Care Doctor: - Call Health Connect at  [phone] - they can help you locate a primary care doctor that  accepts your insurance, provides certain services, etc. - Physician Referral Service- [phone]  Chronic Pain Problems: Organization         Address  Phone   Notes  [facility]  [phone] Patients need to be referred by their primary care doctor.   Medication Assistance: Organization         Address  Phone   Notes  [facility] Medication [facility] [address] [place] [place], [place] [address] [phone] --Must be a resident of [facility] -- Must have NO insurance coverage whatsoever (no Medicaid/ Medicare, etc.) -- The pt. MUST have a primary care doctor  that directs their care regularly and follows them in the community   [facility]ist  [phone]   [employer]  [phone]    Agencies that provide inexpensive medical care: Organization         Address  Phone   Notes  [facility]  [phone]   [facility]    [phone]   [facility] [address] [place], [place] [address] [phone]   [facility] [address], [place] [phone]   Planned Parenthood    [phone]   [facility]    [phone]   Community Health  and [facility]  [address], [place] Phone:  [phone], Fax:  [phone] Hours of Operation:  9 am - 6 pm, M-F.  Also accepts Medicaid/Medicare and self-pay.  [facility]  [address], [place] Phone: [phone], Fax: [phone]. Hours of Operation:  8:30 am - 5:30 pm, M-F.  Also accepts Medicaid and self-pay.  [facility] [place] [address] [place] Phone: [phone]   [facility] [address] Natasha Bence [place], [place] [phone], Ext. [phone] Mondays & Thursdays: 7-9 AM.  First 15 patients are seen on a first come, first serve basis.    Medicaid-accepting [facility] Providers:  Organization         Address  Phone   Notes  [facility] [address], [place] [phone] Also accepts self-pay patients.  [facility] [address] Laurell Josephs [place], [place]  [phone]   [facility] [address], [place] [phone]   [facility] [address] [place] [phone]   Renaye Rakers [address], [place]   [phone] Only accepts [place] Access [place] patients after they have their name applied to their card.   Self-Pay (no insurance) in [facility]:  Organization         Address  Phone   Notes  Sickle Cell Patients, [facility] [address] [place], [place] [phone]   [facility] [address] [place], [place] [phone]   [facility]  [address], [place] [phone]   [facility]/Dr. Osei-Bonsu  [address], [place] or [address], [place] [phone] Phone number for both [place] and [place] locations is the same.  Urgent Medical and [facility] [address], [place] [phone]   [facility]  [address] [place] or [address] [phone]   [facility] [address] [place] [phone], phone; [phone], fax Sees patients 1st and 3rd Saturday of every month.  Must not qualify for public or private insurance (i.e. Medicaid, Medicare, [place] Health Choice, Veterans' Benefits)  Household income should be no more than 200% of the poverty level The clinic cannot treat you if you are pregnant or think you are pregnant  Sexually transmitted diseases are not treated at the clinic.    Dental Care: Organization         Address  Phone  Notes  [facility] [address] [place], [place] [phone] Accepts children  up to age 34 who are enrolled in Medicaid or [place] Health Choice; pregnant women with a Medicaid card; and children who have applied for Medicaid or [place] Health Choice, but were declined, whose parents can pay a reduced fee at time of service.  [facility] [place]  [address], [place] [phone] Accepts children up to age 34 who are enrolled in [place] or [place] Health Choice; pregnant women with a Medicaid card; and children who have applied for Medicaid or [place] Health Choice, but were declined, whose parents can pay a reduced fee at time of service.  [facility]  [address], [place] [phone] Patients are seen by appointment only. Walk-ins are not accepted. [facility] will see patients 34 years of age and older. Monday - Tuesday (8am-5pm) Most Wednesdays (8:30-5pm) $30 per visit, cash only  [facility]  [address], [facility] [phone] Patients are seen by appointment only. Walk-ins are not accepted. [facility] will see patients 618 years of age and older. One Wednesday Evening (Monthly: Volunteer Based).  $30 per visit, cash only   [facility]  [phone] for adults; Children under age 694, call Graduate Pediatric Dentistry at [phone]) [phone]. Children aged 404-14, please call [phone]) [phone] to request a pediatric application.  Dental services are provided in all areas of dental care including fillings, crowns and bridges, complete and partial dentures, implants, gum treatment, root canals, and extractions. Preventive care is also provided. Treatment is provided to both adults and children. Patients are selected via a lottery and there is often a waiting list.   [facility] [address], [place]  [phone] [email or web address]   [facility] [address], [place], [place] [phone], Ext. [phone] Second and Fourth Thursday of each month, opens at 6:30 AM; Clinic ends at 9 AM.  Patients are seen on a first-come first-served basis, and a limited number are seen during each clinic.   [facility]  [address], [place], [place] [phone]   Eligibility Requirements You must have lived in [place], [place], or [place] counties for at least the last three months.   You cannot be eligible for state or federal sponsored [place]healthcare insurance, including CIGNAVeterans Administration, [place]icaid, or [employer]icare.   You generally cannot be eligible for healthcare insurance through your employer.    How to apply: Eligibility screenings are held every Tuesday and Wednesday afternoon from 1:00 pm until 4:00 pm. You do not need an appointment for the interview!  [facility] [address], [place], [place] [phone]   [facility]  [phone]   [facility]  [phone]   [facility]  [phone]    Behavioral Health Resources in the Community: Intensive Outpatient Programs Organization         Address  Phone  Notes  [facility] [address], [place],  [place] [phone]   [facility] [address], [place], [place] [phone]   [facility] [address], [place], [place]  [phone]   [facility] [address],  [place], [place] [phone] or [phone]   Substance Abuse Resources Organization         Address  Phone  Notes  [facility] Drug Services  [phone]   Addiction  [facility]  [phone]   [facility] [place]  [phone]   Floydene Flock  [phone]   Residential & Outpatient Substance Abuse Program  [phone]   Psychological Services Organization         Address  Phone  Notes  [facility]  [phone]   [facility]  [phone]   [facility] [address] [place] [phone] or [phone]    Mobile Crisis Teams Organization         Address  Phone  Notes  [facility], Mobile Crisis Care Unit  [phone]   [facility]  [address] [place], [place] [phone]   Doristine Locks [address] [place] [address] [place] [phone]    Self-Help/Support Groups Organization         Address  Phone             Notes  [facility] [place] - variety of support groups  [phone]- [phone] Call for more information  Narcotics Anonymous (NA), [facility] [address], [employer] [place]  2 meetings at this location   Statistician         Address  Phone  Notes  [facility] [address],    [place]  [phone]   [facility]  [address] [place] [address], [place], [place] [phone]   [facility] [address] [place], [place] [phone] Admissions: 8am-3pm M-F  [facility] [address],    [place], [place] [phone]   [facility] [address] [place], [place], [place] [phone]   [facility] [address]  [place], [place] [phone]   [facility] - Intensive Outpatient [address]ure[address] Joseph[address], [place], [place] [phone]   [facility] [address] [place]  [place], [place] [phone] or [phone]   [facility] ([facility]) [address], [place], [place] [phone] Accepts Medicaid  [facility] [address]  [place] [phone] Substance Abuse/Addiction Treatment   [facility] Organization         Address  Phone  Notes  [facility]  [phone]   Angie Fava, PhD [address] Ervin Knack [place], [place]   [phone] or [phone]   [facility] Behavioral   [address] [place], [place] [phone]   [facility] [address], [place], [place] [phone] Insurance/Medicaid/sponsorship through [facility] and Families [address]                                    [place], [place] [phone] Therapy/tele-psych/case  [facility] [address], [place] [phone]    Dr. Lolly Mustache  [phone]   [facility] [place]  [employer] [place] [facility] 1) [address] [place] 2) [address], [place] 3)  [address], [place] [phone]  [phone]   [facility] Child Abuse Hotline [phone] or [phone] (After Hours)

## 2014-10-25 NOTE — ED Notes (Signed)
Pt here with friends sts thinks CBG may be low; pt appears AMS; pt unable to follow commands and is agitated

## 2015-09-09 ENCOUNTER — Emergency Department

## 2015-09-09 ENCOUNTER — Emergency Department
Admission: EM | Admit: 2015-09-09 | Discharge: 2015-09-09 | Disposition: A | Discharge status: Home / Self Care | Attending: Emergency Medicine | Admitting: Emergency Medicine

## 2015-09-09 ENCOUNTER — Encounter: Payer: Self-pay | Admitting: Emergency Medicine

## 2015-09-09 DIAGNOSIS — S61412A Laceration without foreign body of left hand, initial encounter: Secondary | ICD-10-CM | POA: Diagnosis not present

## 2015-09-09 DIAGNOSIS — Y998 Other external cause status: Secondary | ICD-10-CM | POA: Insufficient documentation

## 2015-09-09 DIAGNOSIS — Z79899 Other long term (current) drug therapy: Secondary | ICD-10-CM | POA: Diagnosis not present

## 2015-09-09 DIAGNOSIS — S62396A Other fracture of fifth metacarpal bone, right hand, initial encounter for closed fracture: Secondary | ICD-10-CM | POA: Insufficient documentation

## 2015-09-09 DIAGNOSIS — E1010 Type 1 diabetes mellitus with ketoacidosis without coma: Secondary | ICD-10-CM | POA: Insufficient documentation

## 2015-09-09 DIAGNOSIS — R451 Restlessness and agitation: Secondary | ICD-10-CM | POA: Diagnosis not present

## 2015-09-09 DIAGNOSIS — Y9389 Activity, other specified: Secondary | ICD-10-CM | POA: Diagnosis not present

## 2015-09-09 DIAGNOSIS — Z794 Long term (current) use of insulin: Secondary | ICD-10-CM | POA: Diagnosis not present

## 2015-09-09 DIAGNOSIS — F17210 Nicotine dependence, cigarettes, uncomplicated: Secondary | ICD-10-CM | POA: Diagnosis not present

## 2015-09-09 DIAGNOSIS — S62306A Unspecified fracture of fifth metacarpal bone, right hand, initial encounter for closed fracture: Secondary | ICD-10-CM

## 2015-09-09 DIAGNOSIS — S62141A Displaced fracture of body of hamate [unciform] bone, right wrist, initial encounter for closed fracture: Secondary | ICD-10-CM | POA: Diagnosis not present

## 2015-09-09 DIAGNOSIS — S6991XA Unspecified injury of right wrist, hand and finger(s), initial encounter: Secondary | ICD-10-CM | POA: Diagnosis present

## 2015-09-09 DIAGNOSIS — S61411A Laceration without foreign body of right hand, initial encounter: Secondary | ICD-10-CM | POA: Diagnosis not present

## 2015-09-09 DIAGNOSIS — Y9289 Other specified places as the place of occurrence of the external cause: Secondary | ICD-10-CM | POA: Insufficient documentation

## 2015-09-09 MED ORDER — AMOXICILLIN-POT CLAVULANATE 875-125 MG PO TABS: 1.0000 | ORAL_TABLET | Freq: Two times a day (BID) | ORAL | Status: AC

## 2015-09-09 NOTE — ED Notes (Signed)
Pt. Has Etoh on board.  Pt. Is being non-cooperative in triage and in room.  Pt. Continues to no follow advice from this nurse.

## 2015-09-09 NOTE — ED Notes (Signed)
Pt taken to xray, accompanied by graham pd
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
## 2015-09-09 NOTE — Discharge Instructions (Signed)
You have at least one, if not two, fractures of right hand.  It is VERY important to follow up with an orthopedic surgeon or you may never have full use of your hand again.  Take the full course of prescribed antibiotics to make sure you do not develop an infection in your hand.   Metacarpal Fracture A metacarpal fracture is a break (fracture) of a bone in the hand. Metacarpals are the bones that extend from your knuckles to your wrist. In each hand, you have five metacarpal bones that connect your fingers and your thumb to your wrist. Some hand fractures have bone pieces that are close together and stable (simple). These fractures may be treated with only a splint or cast. Hand fractures that have many pieces of broken bone (comminuted), unstable bone pieces (displaced), or a bone that breaks through the skin (compound) usually require surgery. CAUSES This injury may be caused by:  A fall.  A hard, direct hit to your hand.  An injury that squeezes your knuckle, stretches your finger out of place, or crushes your hand. RISK FACTORS This injury is more likely to occur if:  You play contact sports.  You have certain bone diseases. SYMPTOMS  Symptoms of this type of fracture develop soon after the injury. Symptoms may include:  Swelling.  Pain.  Stiffness.  Increased pain with movement.  Bruising.  Inability to move a finger.  A shortened finger.  A finger knuckle that looks sunken in.  Unusual appearance of the hand or finger (deformity). DIAGNOSIS  This injury may be diagnosed based on your signs and symptoms, especially if you had a recent hand injury. Your health care provider will perform a physical exam. He or she may also order X-rays to confirm the diagnosis.  TREATMENT  Treatment for this injury depends on the type of fracture you have and how severe it is. Possible treatments include:  Non-reduction. This can be done if the bone does not need to be moved back into  place. The fracture can be casted or splinted as it is.   Closed reduction. If your bone is stable and can be moved back into place, you may only need to wear a cast or splint or have buddy taping.  Closed reduction with internal fixation (CRIF). This is the most common treatment. You may have this procedure if your bone can be moved back into place but needs more support. Wires, pins, or screws may be inserted through your skin to stabilize the fracture.  Open reduction with internal fixation (ORIF). This may be needed if your fracture is severe and unstable. It involves surgery to move your bone back into the right position. Screws, wires, or plates are used to stabilize the fracture. After all procedures, you may need to wear a cast or a splint for several weeks. You will also need to have follow-up X-rays to make sure that the bone is healing well and staying in position. After you no longer need your cast or splint, you may need physical therapy. This will help you to regain full movement and strength in your hand.  HOME CARE INSTRUCTIONS  If You Have a Cast:  Do not stick anything inside the cast to scratch your skin. Doing that increases your risk of infection.  Check the skin around the cast every day. Report any concerns to your health care provider. You may put lotion on dry skin around the edges of the cast. Do not apply lotion to  the skin underneath the cast. If You Have a Splint:  Wear it as directed by your health care provider. Remove it only as directed by your health care provider.  Loosen the splint if your fingers become numb and tingle, or if they turn cold and blue. Bathing  Cover the cast or splint with a watertight plastic bag to protect it from water while you take a bath or a shower. Do not let the cast or splint get wet. Managing Pain, Stiffness, and Swelling  If directed, apply ice to the injured area (if you have a splint, not a cast):  Put ice in a plastic  bag.  Place a towel between your skin and the bag.  Leave the ice on for 20 minutes, 2-3 times a day.  Move your fingers often to avoid stiffness and to lessen swelling.  Raise the injured area above the level of your heart while you are sitting or lying down. Driving  Do not drive or operate heavy machinery while taking pain medicine.  Do not drive while wearing a cast or splint on a hand that you use for driving. Activity  Return to your normal activities as directed by your health care provider. Ask your health care provider what activities are safe for you. General Instructions  Do not put pressure on any part of the cast or splint until it is fully hardened. This may take several hours.  Keep the cast or splint clean and dry.  Do not use any tobacco products, including cigarettes, chewing tobacco, or electronic cigarettes. Tobacco can delay bone healing. If you need help quitting, ask your health care provider.  Take medicines only as directed by your health care provider.  Keep all follow-up visits as directed by your health care provider. This is important. SEEK MEDICAL CARE IF:   Your pain is getting worse.  You have redness, swelling, or pain in the injured area.   You have fluid, blood, or pus coming from under your cast or splint.   You notice a bad smell coming from under your cast or splint.   You have a fever.  SEEK IMMEDIATE MEDICAL CARE IF:   You develop a rash.   You have trouble breathing.   Your skin or nails on your injured hand turn blue or gray even after you loosen your splint.  Your injured hand feels cold or becomes numb even after you loosen your splint.   You develop severe pain under the cast or in your hand.   This information is not intended to replace advice given to you by your health care provider. Make sure you discuss any questions you have with your health care provider.   Document Released: 06/17/2005 Document Revised:  03/08/2015 Document Reviewed: 04/06/2014 Elsevier Interactive Patient Education 2016 [employer]  Nonsutured Laceration Care A laceration is a cut that goes through all layers of the skin and extends into the tissue that is right under the skin. This type of cut is usually stitched up (sutured) or closed with tape (adhesive strips) or skin glue shortly after the injury happens. However, if the wound is dirty or if several hours pass before medical treatment is provided, it is likely that germs (bacteria) will enter the wound. Closing a laceration after bacteria have entered it increases the risk of infection. In these cases, your health care provider may leave the laceration open (nonsutured) and cover it with a bandage. This type of treatment helps prevent infection and allows  the wound to heal from the deepest layer of tissue damage up to the surface. An open fracture is a type of injury that may involve nonsutured lacerations. An open fracture is a break in a bone that happens along with one or more lacerations through the skin that is near the fracture site. HOW TO CARE FOR YOUR NONSUTURED LACERATION  Take or apply over-the-counter and prescription medicines only as told by your health care provider.  If you were prescribed an antibiotic medicine, take or apply it as told by your health care provider. Do not stop using the antibiotic even if your condition improves.  Clean the wound one time each day or as told by your health care provider.  Wash the wound with mild soap and water.  Rinse the wound with water to remove all soap.  Pat your wound dry with a clean towel. Do not rub the wound.  Do not inject anything into the wound unless your health care provider told you to.  Change any bandages (dressings) as told by your health care provider. This includes changing the dressing if it gets wet, dirty, or starts to smell bad.  Keep the dressing dry until your health care provider says  it can be removed. Do not take baths, swim, or do anything that puts your wound underwater until your health care provider approves.  Raise (elevate) the injured area above the level of your heart while you are sitting or lying down, if possible.  Do not scratch or pick at the wound.  Check your wound every day for signs of infection. Watch for:  Redness, swelling, or pain.  Fluid, blood, or pus.  Keep all follow-up visits as told by your health care provider. This is important. SEEK MEDICAL CARE IF:  You received a tetanus and shot and you have swelling, severe pain, redness, or bleeding at the injection site.   You have a fever.  Your pain is not controlled with medicine.  You have increased redness, swelling, or pain at the site of your wound.  You have fluid, blood, or pus coming from your wound.  You notice a bad smell coming from your wound or your dressing.  You notice something coming out of the wound, such as wood or glass.  You notice a change in the color of your skin near your wound.  You develop a new rash.  You need to change the dressing frequently due to fluid, blood, or pus draining from the wound.  You develop numbness around your wound. SEEK IMMEDIATE MEDICAL CARE IF:  Your pain suddenly increases and is severe.  You develop severe swelling around the wound.  The wound is on your hand or foot and you cannot properly move a finger or toe.  The wound is on your hand or foot and you notice that your fingers or toes look pale or bluish.  You have a red streak going away from your wound.   This information is not intended to replace advice given to you by your health care provider. Make sure you discuss any questions you have with your health care provider.   Document Released: 05/15/2006 Document Revised: 11/01/2014 Document Reviewed: 06/13/2014 Elsevier Interactive Patient Education 2016 Else[employer] Inc.  Cast or Splint Care Casts and splints  support injured limbs and keep bones from moving while they heal. It is important to care for your cast or splint at home.  HOME CARE INSTRUCTIONS  Keep the cast or splint uncovered during  the drying period. It can take 24 to 48 hours to dry if it is made of plaster. A fiberglass cast will dry in less than 1 hour.  Do not rest the cast on anything harder than a pillow for the first 24 hours.  Do not put weight on your injured limb or apply pressure to the cast until your health care provider gives you permission.  Keep the cast or splint dry. Wet casts or splints can lose their shape and may not support the limb as well. A wet cast that has lost its shape can also create harmful pressure on your skin when it dries. Also, wet skin can become infected.  Cover the cast or splint with a plastic bag when bathing or when out in the rain or snow. If the cast is on the trunk of the body, take sponge baths until the cast is removed.  If your cast does become wet, dry it with a towel or a blow dryer on the cool setting only.  Keep your cast or splint clean. Soiled casts may be wiped with a moistened cloth.  Do not place any hard or soft foreign objects under your cast or splint, such as cotton, toilet paper, lotion, or powder.  Do not try to scratch the skin under the cast with any object. The object could get stuck inside the cast. Also, scratching could lead to an infection. If itching is a problem, use a blow dryer on a cool setting to relieve discomfort.  Do not trim or cut your cast or remove padding from inside of it.  Exercise all joints next to the injury that are not immobilized by the cast or splint. For example, if you have a long leg cast, exercise the hip joint and toes. If you have an arm cast or splint, exercise the shoulder, elbow, thumb, and fingers.  Elevate your injured arm or leg on 1 or 2 pillows for the first 1 to 3 days to decrease swelling and pain.It is best if you can  comfortably elevate your cast so it is higher than your heart. SEEK MEDICAL CARE IF:   Your cast or splint cracks.  Your cast or splint is too tight or too loose.  You have unbearable itching inside the cast.  Your cast becomes wet or develops a soft spot or area.  You have a bad smell coming from inside your cast.  You get an object stuck under your cast.  Your skin around the cast becomes red or raw.  You have new pain or worsening pain after the cast has been applied. SEEK IMMEDIATE MEDICAL CARE IF:   You have fluid leaking through the cast.  You are unable to move your fingers or toes.  You have discolored (blue or white), cool, painful, or very swollen fingers or toes beyond the cast.  You have tingling or numbness around the injured area.  You have severe pain or pressure under the cast.  You have any difficulty with your breathing or have shortness of breath.  You have chest pain.   This information is not intended to replace advice given to you by your health care provider. Make sure you discuss any questions you have with your health care provider.   Document Released: 06/14/2000 Document Revised: 04/07/2013 Document Reviewed: 12/24/2012 Elsevier Interactive Patient Education [employer]

## 2015-09-09 NOTE — ED Notes (Signed)
Pt removed steri-strips, skin tear currently bleeding, pt attempting to leave, [place] [employer] continues to be at bedside
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
## 2015-09-09 NOTE — ED Notes (Signed)
Wound cleaned again with sterile saline and gauze, steri-strips placed again

## 2016-03-26 ENCOUNTER — Emergency Department (HOSPITAL_COMMUNITY)
Admission: EM | Admit: 2016-03-26 | Discharge: 2016-03-26 | Disposition: A | Discharge status: Home / Self Care | Attending: Emergency Medicine | Admitting: Emergency Medicine

## 2016-03-26 ENCOUNTER — Encounter (HOSPITAL_COMMUNITY): Payer: Self-pay

## 2016-03-26 ENCOUNTER — Emergency Department (HOSPITAL_COMMUNITY)

## 2016-03-26 DIAGNOSIS — F17210 Nicotine dependence, cigarettes, uncomplicated: Secondary | ICD-10-CM | POA: Diagnosis not present

## 2016-03-26 DIAGNOSIS — Y9289 Other specified places as the place of occurrence of the external cause: Secondary | ICD-10-CM | POA: Diagnosis not present

## 2016-03-26 DIAGNOSIS — G44309 Post-traumatic headache, unspecified, not intractable: Secondary | ICD-10-CM

## 2016-03-26 DIAGNOSIS — Y999 Unspecified external cause status: Secondary | ICD-10-CM | POA: Diagnosis not present

## 2016-03-26 DIAGNOSIS — F0781 Postconcussional syndrome: Secondary | ICD-10-CM | POA: Insufficient documentation

## 2016-03-26 DIAGNOSIS — S0990XA Unspecified injury of head, initial encounter: Secondary | ICD-10-CM | POA: Diagnosis present

## 2016-03-26 DIAGNOSIS — Z79899 Other long term (current) drug therapy: Secondary | ICD-10-CM | POA: Diagnosis not present

## 2016-03-26 DIAGNOSIS — E109 Type 1 diabetes mellitus without complications: Secondary | ICD-10-CM | POA: Diagnosis not present

## 2016-03-26 DIAGNOSIS — Y939 Activity, unspecified: Secondary | ICD-10-CM | POA: Insufficient documentation

## 2016-03-26 DIAGNOSIS — Z5181 Encounter for therapeutic drug level monitoring: Secondary | ICD-10-CM | POA: Insufficient documentation

## 2016-03-26 LAB — URINALYSIS, ROUTINE W REFLEX MICROSCOPIC
Bilirubin Urine: NEGATIVE
Hgb urine dipstick: NEGATIVE
KETONES UR: NEGATIVE mg/dL
LEUKOCYTES UA: NEGATIVE
NITRITE: NEGATIVE
PH: 6.5 (ref 5.0–8.0)
Protein, ur: NEGATIVE mg/dL

## 2016-03-26 LAB — I-STAT CHEM 8, ED
BUN: 15 mg/dL (ref 6–20)
CREATININE: 1.2 mg/dL (ref 0.61–1.24)
Calcium, Ion: 1.05 mmol/L — ABNORMAL LOW (ref 1.15–1.40)
Chloride: 97 mmol/L — ABNORMAL LOW (ref 101–111)
GLUCOSE: 372 mg/dL — AB (ref 65–99)
HEMATOCRIT: 52 % (ref 39.0–52.0)
HEMOGLOBIN: 17.7 g/dL — AB (ref 13.0–17.0)
POTASSIUM: 4.5 mmol/L (ref 3.5–5.1)
Sodium: 135 mmol/L (ref 135–145)
TCO2: 30 mmol/L (ref 0–100)

## 2016-03-26 LAB — URINE MICROSCOPIC-ADD ON

## 2016-03-26 LAB — COMPREHENSIVE METABOLIC PANEL
ALT: 35 U/L (ref 17–63)
AST: 31 U/L (ref 15–41)
Albumin: 3.6 g/dL (ref 3.5–5.0)
Alkaline Phosphatase: 49 U/L (ref 38–126)
Anion gap: 9 (ref 5–15)
BUN: 11 mg/dL (ref 6–20)
CHLORIDE: 101 mmol/L (ref 101–111)
CO2: 26 mmol/L (ref 22–32)
Calcium: 9.2 mg/dL (ref 8.9–10.3)
Creatinine, Ser: 1.31 mg/dL — ABNORMAL HIGH (ref 0.61–1.24)
Glucose, Bld: 375 mg/dL — ABNORMAL HIGH (ref 65–99)
POTASSIUM: 4 mmol/L (ref 3.5–5.1)
SODIUM: 136 mmol/L (ref 135–145)
Total Bilirubin: 1.4 mg/dL — ABNORMAL HIGH (ref 0.3–1.2)
Total Protein: 6.4 g/dL — ABNORMAL LOW (ref 6.5–8.1)

## 2016-03-26 LAB — I-STAT CG4 LACTIC ACID, ED
LACTIC ACID, VENOUS: 3.85 mmol/L — AB (ref 0.5–1.9)
Lactic Acid, Venous: 2.32 mmol/L (ref 0.5–1.9)

## 2016-03-26 LAB — CBC
HEMATOCRIT: 48.1 % (ref 39.0–52.0)
HEMOGLOBIN: 16.2 g/dL (ref 13.0–17.0)
MCH: 28.8 pg (ref 26.0–34.0)
MCHC: 33.7 g/dL (ref 30.0–36.0)
MCV: 85.6 fL (ref 78.0–100.0)
PLATELETS: 214 10*3/uL (ref 150–400)
RBC: 5.62 MIL/uL (ref 4.22–5.81)
RDW: 14.9 % (ref 11.5–15.5)
WBC: 6.6 10*3/uL (ref 4.0–10.5)

## 2016-03-26 LAB — CBG MONITORING, ED
GLUCOSE-CAPILLARY: 434 mg/dL — AB (ref 65–99)
GLUCOSE-CAPILLARY: 287 mg/dL — AB (ref 65–99)
Glucose-Capillary: 423 mg/dL — ABNORMAL HIGH (ref 65–99)

## 2016-03-26 LAB — PROTIME-INR
INR: 0.98
Prothrombin Time: 13 seconds (ref 11.4–15.2)

## 2016-03-26 LAB — RAPID URINE DRUG SCREEN, HOSP PERFORMED
Amphetamines: NOT DETECTED
BENZODIAZEPINES: NOT DETECTED
Barbiturates: NOT DETECTED
COCAINE: POSITIVE — AB
Opiates: POSITIVE — AB
Tetrahydrocannabinol: POSITIVE — AB

## 2016-03-26 LAB — SAMPLE TO BLOOD BANK

## 2016-03-26 LAB — CDS SEROLOGY

## 2016-03-26 LAB — ETHANOL: Alcohol, Ethyl (B): <5 mg/dL (ref <5)

## 2016-03-26 MED ORDER — SODIUM CHLORIDE 0.9 % IV BOLUS (SEPSIS)
1000.0000 mL | Freq: Once | INTRAVENOUS | Status: AC
  Administered 2016-03-26: 1000 mL via INTRAVENOUS

## 2016-03-26 MED ORDER — ACETAMINOPHEN 500 MG PO TABS: 1000.0000 mg | ORAL_TABLET | Freq: Once | ORAL | Status: DC
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
## 2016-03-26 NOTE — ED Notes (Signed)
MD at the bedside  
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
## 2016-03-26 NOTE — ED Notes (Signed)
Phlebotomy at the bedside  

## 2016-03-26 NOTE — ED Notes (Signed)
Pt returned from CT °
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
## 2016-03-26 NOTE — ED Notes (Signed)
Pt placed back in room and stated he did not want the side rail back up.
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
## 2016-05-15 ENCOUNTER — Emergency Department (HOSPITAL_COMMUNITY)

## 2016-05-15 ENCOUNTER — Inpatient Hospital Stay (HOSPITAL_COMMUNITY)
Admission: EM | Admit: 2016-05-15 | Discharge: 2016-05-17 | DRG: 093 | Disposition: A | Discharge status: Home / Self Care | Attending: Internal Medicine | Admitting: Internal Medicine

## 2016-05-15 DIAGNOSIS — I10 Essential (primary) hypertension: Secondary | ICD-10-CM

## 2016-05-15 DIAGNOSIS — M545 Low back pain: Secondary | ICD-10-CM | POA: Diagnosis not present

## 2016-05-15 DIAGNOSIS — R4182 Altered mental status, unspecified: Secondary | ICD-10-CM | POA: Diagnosis not present

## 2016-05-15 DIAGNOSIS — Z765 Malingerer [conscious simulation]: Secondary | ICD-10-CM

## 2016-05-15 DIAGNOSIS — F17210 Nicotine dependence, cigarettes, uncomplicated: Secondary | ICD-10-CM | POA: Diagnosis present

## 2016-05-15 DIAGNOSIS — IMO0001 Reserved for inherently not codable concepts without codable children: Secondary | ICD-10-CM

## 2016-05-15 DIAGNOSIS — R000 Tachycardia, unspecified: Secondary | ICD-10-CM | POA: Diagnosis present

## 2016-05-15 DIAGNOSIS — G928 Other toxic encephalopathy: Secondary | ICD-10-CM | POA: Diagnosis present

## 2016-05-15 DIAGNOSIS — Z8249 Family history of ischemic heart disease and other diseases of the circulatory system: Secondary | ICD-10-CM

## 2016-05-15 DIAGNOSIS — E109 Type 1 diabetes mellitus without complications: Secondary | ICD-10-CM | POA: Diagnosis not present

## 2016-05-15 DIAGNOSIS — E1110 Type 2 diabetes mellitus with ketoacidosis without coma: Secondary | ICD-10-CM

## 2016-05-15 DIAGNOSIS — G92 Toxic encephalopathy: Secondary | ICD-10-CM | POA: Diagnosis not present

## 2016-05-15 DIAGNOSIS — G9340 Encephalopathy, unspecified: Secondary | ICD-10-CM

## 2016-05-15 DIAGNOSIS — Z886 Allergy status to analgesic agent status: Secondary | ICD-10-CM

## 2016-05-15 DIAGNOSIS — E1165 Type 2 diabetes mellitus with hyperglycemia: Secondary | ICD-10-CM

## 2016-05-15 DIAGNOSIS — Z79899 Other long term (current) drug therapy: Secondary | ICD-10-CM

## 2016-05-15 DIAGNOSIS — E860 Dehydration: Secondary | ICD-10-CM

## 2016-05-15 DIAGNOSIS — Z794 Long term (current) use of insulin: Secondary | ICD-10-CM

## 2016-05-15 DIAGNOSIS — I160 Hypertensive urgency: Secondary | ICD-10-CM

## 2016-05-15 DIAGNOSIS — E1065 Type 1 diabetes mellitus with hyperglycemia: Secondary | ICD-10-CM | POA: Diagnosis present

## 2016-05-15 LAB — RAPID URINE DRUG SCREEN, HOSP PERFORMED
AMPHETAMINES: NOT DETECTED
BENZODIAZEPINES: NOT DETECTED
Barbiturates: NOT DETECTED
Cocaine: NOT DETECTED
Opiates: NOT DETECTED
TETRAHYDROCANNABINOL: POSITIVE — AB

## 2016-05-15 LAB — CBG MONITORING, ED
Glucose-Capillary: 185 mg/dL — ABNORMAL HIGH (ref 65–99)
Glucose-Capillary: 230 mg/dL — ABNORMAL HIGH (ref 65–99)

## 2016-05-15 LAB — COMPREHENSIVE METABOLIC PANEL
ALBUMIN: 4.3 g/dL (ref 3.5–5.0)
ALK PHOS: 64 U/L (ref 38–126)
ALT: 28 U/L (ref 17–63)
ANION GAP: 11 (ref 5–15)
AST: 23 U/L (ref 15–41)
BUN: 22 mg/dL — ABNORMAL HIGH (ref 6–20)
CALCIUM: 10.1 mg/dL (ref 8.9–10.3)
CO2: 26 mmol/L (ref 22–32)
Chloride: 102 mmol/L (ref 101–111)
Creatinine, Ser: 1.17 mg/dL (ref 0.61–1.24)
GFR calc Af Amer: >60 mL/min (ref >60)
GFR calc non Af Amer: >60 mL/min (ref >60)
GLUCOSE: 126 mg/dL — AB (ref 65–99)
Potassium: 4 mmol/L (ref 3.5–5.1)
SODIUM: 139 mmol/L (ref 135–145)
Total Bilirubin: 1.3 mg/dL — ABNORMAL HIGH (ref 0.3–1.2)
Total Protein: 8.2 g/dL — ABNORMAL HIGH (ref 6.5–8.1)

## 2016-05-15 LAB — BLOOD GAS, VENOUS
Acid-Base Excess: 3.3 mmol/L — ABNORMAL HIGH (ref 0.0–2.0)
Bicarbonate: 28.8 mmol/L — ABNORMAL HIGH (ref 20.0–28.0)
O2 SAT: 74.2 %
PCO2 VEN: 47.5 mmHg (ref 44.0–60.0)
PH VEN: 7.4 (ref 7.250–7.430)
Patient temperature: 98.6
pO2, Ven: 40.4 mmHg (ref 32.0–45.0)

## 2016-05-15 LAB — CBC WITH DIFFERENTIAL/PLATELET
Basophils Absolute: 0 10*3/uL (ref 0.0–0.1)
Basophils Relative: 0 %
Eosinophils Absolute: 0.1 10*3/uL (ref 0.0–0.7)
Eosinophils Relative: 1 %
HEMATOCRIT: 55.8 % — AB (ref 39.0–52.0)
HEMOGLOBIN: 20.3 g/dL — AB (ref 13.0–17.0)
LYMPHS ABS: 2.3 10*3/uL (ref 0.7–4.0)
LYMPHS PCT: 28 %
MCH: 29.4 pg (ref 26.0–34.0)
MCHC: 36.4 g/dL — AB (ref 30.0–36.0)
MCV: 80.9 fL (ref 78.0–100.0)
MONOS PCT: 7 %
Monocytes Absolute: 0.6 10*3/uL (ref 0.1–1.0)
NEUTROS ABS: 5.2 10*3/uL (ref 1.7–7.7)
NEUTROS PCT: 64 %
Platelets: 274 10*3/uL (ref 150–400)
RBC: 6.9 MIL/uL — ABNORMAL HIGH (ref 4.22–5.81)
RDW: 14 % (ref 11.5–15.5)
WBC: 8.1 10*3/uL (ref 4.0–10.5)

## 2016-05-15 LAB — URINE MICROSCOPIC-ADD ON: RBC / HPF: NONE SEEN RBC/hpf (ref 0–5)

## 2016-05-15 LAB — URINALYSIS, ROUTINE W REFLEX MICROSCOPIC
Bilirubin Urine: NEGATIVE
HGB URINE DIPSTICK: NEGATIVE
Ketones, ur: 15 mg/dL — AB
Leukocytes, UA: NEGATIVE
Nitrite: NEGATIVE
Protein, ur: NEGATIVE mg/dL
SPECIFIC GRAVITY, URINE: 1.044 — AB (ref 1.005–1.030)
pH: 6.5 (ref 5.0–8.0)

## 2016-05-15 LAB — I-STAT TROPONIN, ED: TROPONIN I, POC: 0.01 ng/mL (ref 0.00–0.08)

## 2016-05-15 LAB — ETHANOL: Alcohol, Ethyl (B): <5 mg/dL (ref <5)

## 2016-05-15 MED ORDER — LABETALOL HCL 5 MG/ML IV SOLN
10.0000 mg | INTRAVENOUS | Status: DC | PRN
  Administered 2016-05-16 (×2): 10 mg via INTRAVENOUS
  Filled 2016-05-15 (×4): qty 4

## 2016-05-15 MED ORDER — INSULIN ASPART 100 UNIT/ML ~~LOC~~ SOLN: 0.0000 [IU] | Freq: Three times a day (TID) | SUBCUTANEOUS | Status: DC

## 2016-05-15 MED ORDER — SODIUM CHLORIDE 0.9 % IV BOLUS (SEPSIS)
1000.0000 mL | Freq: Once | INTRAVENOUS | Status: AC
  Administered 2016-05-15: 1000 mL via INTRAVENOUS

## 2016-05-15 MED ORDER — INSULIN ASPART PROT & ASPART (70-30 MIX) 100 UNIT/ML ~~LOC~~ SUSP
8.0000 [IU] | Freq: Three times a day (TID) | SUBCUTANEOUS | Status: DC
  Administered 2016-05-16: 8 [IU] via SUBCUTANEOUS
  Filled 2016-05-15: qty 10

## 2016-05-15 MED ORDER — AMLODIPINE BESYLATE 10 MG PO TABS
10.0000 mg | ORAL_TABLET | Freq: Every day | ORAL | Status: DC
  Administered 2016-05-16 – 2016-05-17 (×2): 10 mg via ORAL
  Filled 2016-05-15 (×2): qty 1

## 2016-05-15 MED ORDER — LABETALOL HCL 5 MG/ML IV SOLN
10.0000 mg | Freq: Once | INTRAVENOUS | Status: AC
  Administered 2016-05-15: 10 mg via INTRAVENOUS
  Filled 2016-05-15: qty 4

## 2016-05-15 MED ORDER — ENOXAPARIN SODIUM 40 MG/0.4ML ~~LOC~~ SOLN
40.0000 mg | Freq: Every day | SUBCUTANEOUS | Status: DC
  Administered 2016-05-16 – 2016-05-17 (×3): 40 mg via SUBCUTANEOUS
  Filled 2016-05-15 (×3): qty 0.4

## 2016-05-15 MED ORDER — INSULIN ASPART PROT & ASPART (70-30 MIX) 100 UNIT/ML ~~LOC~~ SUSP
8.0000 [IU] | Freq: Two times a day (BID) | SUBCUTANEOUS | Status: DC
Start: 2016-05-16 — End: 2016-05-15

## 2016-05-15 MED ORDER — METFORMIN HCL ER 500 MG PO TB24
500.0000 mg | ORAL_TABLET | Freq: Every day | ORAL | Status: DC
  Filled 2016-05-15: qty 1

## 2016-05-15 MED ORDER — SODIUM CHLORIDE 0.9 % IV SOLN
INTRAVENOUS | Status: DC
  Administered 2016-05-16: 01:00:00 via INTRAVENOUS

## 2016-05-15 NOTE — ED Notes (Signed)
RN starting IV 

## 2016-05-15 NOTE — ED Notes (Signed)
Pt made aware of need for urine specimen 

## 2016-05-15 NOTE — ED Notes (Signed)
Istat in mini lab 
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
## 2016-05-15 NOTE — H&P (Signed)
History and Physical    Matthew [name] WUJ:[id number] DOB: 26-Jun-1981 DOA: 05/15/2016   PCP: No PCP Per Patient Chief Complaint:  Chief Complaint  Patient presents with  . Tachycardia  . Altered Mental Status    HPI: Matthew [name] is a 35 y.o. male with medical history significant of DM1, HTN.  Patient presents to the ED from [place] where he has not been receiving meds for the past five days, eating or drinking, (patient alleges that [place] wouldn't give him meds, [place] says he refused meds).  Patient initially tachycardic and altered in ED.  Mental status improved after 3L IVF bolus in ED.  CBG was 120, last insulin dose 1130 at jail today.  BPs running 180s systolic.  Patient is not providing great history at this time.  States he takes insulin "when he can".  Looking at records it looks like he has previously been on 12 units TID AC.  ED Course: Given 3L IVF, labetalol, mental status is improved.  Review of Systems: As per HPI otherwise 10 point review of systems negative.    Past Medical History:  Diagnosis Date  . Diabetes mellitus without complication (HCC)   . DM type 1 (diabetes mellitus, type 1) (HCC) 2010   diagnosed in ED at [facility], started on insulin    No past surgical history on file.   reports that he has been smoking Cigarettes.  He has a 15.00 pack-year smoking history. He has never used smokeless tobacco. He reports that he drinks alcohol. He reports that he uses drugs, including Cocaine and Marijuana.  Allergies  Allergen Reactions  . Ibuprofen     itching    Family History  Problem Relation Age of Onset  . Hypertension Father       Prior to Admission medications   Medication Sig Start Date End Date Taking? Authorizing Provider  insulin NPH-regular Human (HUMULIN 70/30) (70-30) 100 UNIT/ML injection Inject 12 Units into the skin 3 (three) times daily.   Yes Historical Provider, MD  insulin regular (NOVOLIN R,HUMULIN R) 250 units/2.55mL (100  units/mL) injection Inject into the skin 3 (three) times daily before meals. Per sliding scale.   Yes Historical Provider, MD  amLODipine (NORVASC) 10 MG tablet Take 10 mg by mouth daily.    Historical Provider, MD  metFORMIN (GLUCOPHAGE-XR) 500 MG 24 hr tablet Take 500 mg by mouth daily with breakfast.    Historical Provider, MD    Physical Exam: Vitals:   05/15/16 1456 05/15/16 2026 05/15/16 2228  BP: 153/85 (!) 184/112 (!) 169/126  Pulse: (!) 129 93 106  Resp:  20 20  Temp: 98.5 F (36.9 C)    TempSrc: Oral    SpO2: 97% 100% 98%      Constitutional: NAD, calm, comfortable Eyes: PERRL, lids and conjunctivae normal ENMT: Mucous membranes are moist. Posterior pharynx clear of any exudate or lesions.Normal dentition.  Neck: normal, supple, no masses, no thyromegaly Respiratory: clear to auscultation bilaterally, no wheezing, no crackles. Normal respiratory effort. No accessory muscle use.  Cardiovascular: Regular rate and rhythm, no murmurs / rubs / gallops. No extremity edema. 2+ pedal pulses. No carotid bruits.  Abdomen: no tenderness, no masses palpated. No hepatosplenomegaly. Bowel sounds positive.  Musculoskeletal: no clubbing / cyanosis. No joint deformity upper and lower extremities. Good ROM, no contractures. Normal muscle tone.  Skin: no rashes, lesions, ulcers. No induration Neurologic: CN 2-12 grossly intact. Sensation intact, DTR normal. Strength 5/5 in all 4.  Psychiatric: Normal judgment  and insight. Alert and oriented x 3. Normal mood.    Labs on Admission: I have personally reviewed following labs and imaging studies  CBC:  Recent Labs Lab 05/15/16 1600  WBC 8.1  NEUTROABS 5.2  HGB 20.3*  HCT 55.8*  MCV 80.9  PLT 274   Basic Metabolic Panel:  Recent Labs Lab 05/15/16 1600  NA 139  K 4.0  CL 102  CO2 26  GLUCOSE 126*  BUN 22*  CREATININE 1.17  CALCIUM 10.1   GFR: CrCl cannot be calculated (Unknown ideal weight.). Liver Function  Tests:  Recent Labs Lab 05/15/16 1600  AST 23  ALT 28  ALKPHOS 64  BILITOT 1.3*  PROT 8.2*  ALBUMIN 4.3   No results for input(s): LIPASE, AMYLASE in the last 168 hours. No results for input(s): AMMONIA in the last 168 hours. Coagulation Profile: No results for input(s): INR, PROTIME in the last 168 hours. Cardiac Enzymes: No results for input(s): CKTOTAL, CKMB, CKMBINDEX, TROPONINI in the last 168 hours. BNP (last 3 results) No results for input(s): PROBNP in the last 8760 hours. HbA1C: No results for input(s): HGBA1C in the last 72 hours. CBG:  Recent Labs Lab 05/15/16 1503  GLUCAP 185*   Lipid Profile: No results for input(s): CHOL, HDL, LDLCALC, TRIG, CHOLHDL, LDLDIRECT in the last 72 hours. Thyroid Function Tests: No results for input(s): TSH, T4TOTAL, FREET4, T3FREE, THYROIDAB in the last 72 hours. Anemia Panel: No results for input(s): VITAMINB12, FOLATE, FERRITIN, TIBC, IRON, RETICCTPCT in the last 72 hours. Urine analysis:    Component Value Date/Time   COLORURINE YELLOW 05/15/2016 1600   APPEARANCEUR CLOUDY (A) 05/15/2016 1600   LABSPEC 1.044 (H) 05/15/2016 1600   PHURINE 6.5 05/15/2016 1600   GLUCOSEU >1000 (A) 05/15/2016 1600   HGBUR NEGATIVE 05/15/2016 1600   BILIRUBINUR NEGATIVE 05/15/2016 1600   KETONESUR 15 (A) 05/15/2016 1600   PROTEINUR NEGATIVE 05/15/2016 1600   UROBILINOGEN 0.2 10/25/2014 1239   NITRITE NEGATIVE 05/15/2016 1600   LEUKOCYTESUR NEGATIVE 05/15/2016 1600   Sepsis Labs: @LABRCNTIP (procalcitonin:4,lacticidven:4) )No results found for this or any previous visit (from the past 240 hour(s)).   Radiological Exams on Admission: Ct Head Wo Contrast  Result Date: 05/15/2016 CLINICAL DATA:  Altered mental status/lethargy EXAM: CT HEAD WITHOUT CONTRAST TECHNIQUE: Contiguous axial images were obtained from the base of the skull through the vertex without intravenous contrast. COMPARISON:  March 26, 2016 FINDINGS: Brain: The  ventricles are normal in size and configuration. There is no intracranial mass, hemorrhage, extra-axial fluid collection, or midline shift. Gray-white compartments appear normal. No acute infarct evident. Vascular: No hyperdense vessel evident. No vascular calcifications evident. Skull: The bony calvarium appears intact. Old fracture right nasal bone again noted. Sinuses/Orbits: There is mucosal thickening in several ethmoid air cells bilaterally. Other paranasal sinuses appear clear. Orbits appear symmetric bilaterally. Other: Mastoid air cells are clear. IMPRESSION: Ethmoid sinus disease bilaterally. Old right nasal bone fracture. No intracranial mass hemorrhage, or extra-axial fluid collection. Gray-white compartments appear normal. Electronically Signed   By: Bretta BangWilliam  Woodruff III M.D.   On: 05/15/2016 17:03   Dg Chest Portable 1 View  Result Date: 05/15/2016 CLINICAL DATA:  Headache today. EXAM: PORTABLE CHEST 1 VIEW COMPARISON:  Single-view of the chest 04/02/2014. FINDINGS: Lungs are clear. Heart size is normal. No pneumothorax or pleural effusion. IMPRESSION: Negative chest. Electronically Signed   By: Drusilla Kannerhomas  Dalessio M.D.   On: 05/15/2016 15:53    EKG: Independently reviewed.  Assessment/Plan Principal Problem:   Toxic  metabolic encephalopathy Active Problems:   DM type 1 (diabetes mellitus, type 1) (HCC)   Hypertension    1. Toxic metabolic encephalopathy - 1. Think that patient got dehydrated due to uncontrolled DM (not clear if a true type 1 or if a type 2 as he didn't go in to DKA, looking back I only see a single "DKA" admission despite the very questionable compliance, even then it looks like it may have been lactic acidosis more than ketoacidosis), in combination with not-eating / drinking. 2. This appears resolved at the moment 3. Will continue hydration 2. DM1 - 1. Looks like patient was on 70-30 TID AC at 12 units 2. Will put patient on 70-30 TID AC 8 units 3. Hold  metformin 4. Add sensitive scale SSI AC/HS 5. CBG in ED right now is 230 3. HTN - 1. Resume norvasc 2. Labetalol PRN   DVT prophylaxis: Lovenox Code Status: Full Family Communication: No family in room Consults called: None Admission status: Place in 53obs   Rosselyn Martha, Heywood IlesJARED M. DO [facility] Pager [phone] from 7PM-7AM  If 7AM-7PM, please contact the day physician for the patient [email or web address] Password [email or web address]  05/15/2016, 11:22 PM

## 2016-05-15 NOTE — ED Triage Notes (Addendum)
[facility]. EMS denies neuro deficits. Pt is lethargic, unable to state correct date, or place, pt speaking with eyes closed, and moaning upon arrival. Pt pulse in triage 130s.  EMS Vitals BP 146/88 P 102 CBG 211

## 2016-05-16 ENCOUNTER — Encounter (HOSPITAL_COMMUNITY): Payer: Self-pay | Admitting: Nurse Practitioner

## 2016-05-16 DIAGNOSIS — I160 Hypertensive urgency: Secondary | ICD-10-CM | POA: Diagnosis not present

## 2016-05-16 DIAGNOSIS — E1065 Type 1 diabetes mellitus with hyperglycemia: Secondary | ICD-10-CM | POA: Diagnosis present

## 2016-05-16 DIAGNOSIS — Z79899 Other long term (current) drug therapy: Secondary | ICD-10-CM | POA: Diagnosis not present

## 2016-05-16 DIAGNOSIS — F17210 Nicotine dependence, cigarettes, uncomplicated: Secondary | ICD-10-CM | POA: Diagnosis present

## 2016-05-16 DIAGNOSIS — Z765 Malingerer [conscious simulation]: Secondary | ICD-10-CM | POA: Diagnosis not present

## 2016-05-16 DIAGNOSIS — R4182 Altered mental status, unspecified: Secondary | ICD-10-CM | POA: Diagnosis present

## 2016-05-16 DIAGNOSIS — Z886 Allergy status to analgesic agent status: Secondary | ICD-10-CM | POA: Diagnosis not present

## 2016-05-16 DIAGNOSIS — M545 Low back pain: Secondary | ICD-10-CM | POA: Diagnosis not present

## 2016-05-16 DIAGNOSIS — G92 Toxic encephalopathy: Secondary | ICD-10-CM | POA: Diagnosis not present

## 2016-05-16 DIAGNOSIS — I10 Essential (primary) hypertension: Secondary | ICD-10-CM | POA: Diagnosis present

## 2016-05-16 DIAGNOSIS — Z8249 Family history of ischemic heart disease and other diseases of the circulatory system: Secondary | ICD-10-CM | POA: Diagnosis not present

## 2016-05-16 DIAGNOSIS — E109 Type 1 diabetes mellitus without complications: Secondary | ICD-10-CM | POA: Diagnosis not present

## 2016-05-16 DIAGNOSIS — Z794 Long term (current) use of insulin: Secondary | ICD-10-CM | POA: Diagnosis not present

## 2016-05-16 DIAGNOSIS — E1165 Type 2 diabetes mellitus with hyperglycemia: Secondary | ICD-10-CM | POA: Diagnosis not present

## 2016-05-16 DIAGNOSIS — R000 Tachycardia, unspecified: Secondary | ICD-10-CM | POA: Diagnosis present

## 2016-05-16 DIAGNOSIS — E860 Dehydration: Secondary | ICD-10-CM | POA: Diagnosis present

## 2016-05-16 LAB — GLUCOSE, CAPILLARY
GLUCOSE-CAPILLARY: 397 mg/dL — AB (ref 65–99)
GLUCOSE-CAPILLARY: 92 mg/dL (ref 65–99)
Glucose-Capillary: 307 mg/dL — ABNORMAL HIGH (ref 65–99)
Glucose-Capillary: 390 mg/dL — ABNORMAL HIGH (ref 65–99)
Glucose-Capillary: 322 mg/dL — ABNORMAL HIGH (ref 65–99)

## 2016-05-16 LAB — BASIC METABOLIC PANEL
Anion gap: 6 (ref 5–15)
BUN: 15 mg/dL (ref 6–20)
CHLORIDE: 101 mmol/L (ref 101–111)
CO2: 27 mmol/L (ref 22–32)
CREATININE: 1.2 mg/dL (ref 0.61–1.24)
Calcium: 8.7 mg/dL — ABNORMAL LOW (ref 8.9–10.3)
GFR calc Af Amer: >60 mL/min (ref >60)
GFR calc non Af Amer: >60 mL/min (ref >60)
GLUCOSE: 293 mg/dL — AB (ref 65–99)
POTASSIUM: 4.1 mmol/L (ref 3.5–5.1)
SODIUM: 134 mmol/L — AB (ref 135–145)

## 2016-05-16 LAB — MRSA PCR SCREENING: MRSA BY PCR: NEGATIVE

## 2016-05-16 MED ORDER — INSULIN ASPART PROT & ASPART (70-30 MIX) 100 UNIT/ML ~~LOC~~ SUSP
4.0000 [IU] | Freq: Once | SUBCUTANEOUS | Status: AC
  Administered 2016-05-16: 4 [IU] via SUBCUTANEOUS

## 2016-05-16 MED ORDER — CLONIDINE HCL 0.1 MG PO TABS
0.1000 mg | ORAL_TABLET | Freq: Four times a day (QID) | ORAL | Status: DC | PRN
  Administered 2016-05-16 – 2016-05-17 (×3): 0.1 mg via ORAL
  Filled 2016-05-16 (×3): qty 1

## 2016-05-16 MED ORDER — INSULIN ASPART PROT & ASPART (70-30 MIX) 100 UNIT/ML ~~LOC~~ SUSP
12.0000 [IU] | Freq: Three times a day (TID) | SUBCUTANEOUS | Status: DC
  Administered 2016-05-16: 12 [IU] via SUBCUTANEOUS

## 2016-05-16 MED ORDER — ONDANSETRON HCL 4 MG/2ML IJ SOLN: 4.0000 mg | INTRAMUSCULAR | Status: DC | PRN

## 2016-05-16 MED ORDER — INSULIN ASPART PROT & ASPART (70-30 MIX) 100 UNIT/ML ~~LOC~~ SUSP: 16.0000 [IU] | Freq: Three times a day (TID) | SUBCUTANEOUS | Status: DC

## 2016-05-16 MED ORDER — LABETALOL HCL 100 MG PO TABS
200.0000 mg | ORAL_TABLET | Freq: Two times a day (BID) | ORAL | Status: DC
  Administered 2016-05-16 – 2016-05-17 (×3): 200 mg via ORAL
  Filled 2016-05-16 (×3): qty 2

## 2016-05-16 MED ORDER — ACETAMINOPHEN 325 MG PO TABS
650.0000 mg | ORAL_TABLET | ORAL | Status: DC | PRN
  Administered 2016-05-16 – 2016-05-17 (×5): 650 mg via ORAL
  Filled 2016-05-16 (×5): qty 2

## 2016-05-16 MED ORDER — METFORMIN HCL ER 500 MG PO TB24
500.0000 mg | ORAL_TABLET | Freq: Every day | ORAL | Status: DC
  Administered 2016-05-16 – 2016-05-17 (×2): 500 mg via ORAL
  Filled 2016-05-16 (×2): qty 1

## 2016-05-16 MED ORDER — HYDRALAZINE HCL 20 MG/ML IJ SOLN
10.0000 mg | Freq: Once | INTRAMUSCULAR | Status: AC
  Administered 2016-05-16: 10 mg via INTRAVENOUS
  Filled 2016-05-16: qty 1

## 2016-05-16 NOTE — Progress Notes (Signed)
PROGRESS NOTE    Matthew [name]  ZOX:[id number] DOB: 1981-06-22 DOA: 05/15/2016  PCP: No PCP Per Patient   Brief Narrative:  Matthew [name] is a 35 y.o. male with medical history significant of DM1, HTN.  Patient presents to the ED from [place] where he has not been receiving meds for the past five days, eating or drinking, (patient alleges that [place] wouldn't give him meds, [place] says he refused meds).  Patient initially tachycardic and altered in ED.  Mental status improved after 3L IVF bolus in ED.  CBG was 120, last insulin dose 1130 at jail. Found to be dehydrated.  Subjective: Still has a headache. Has mid and lower back pain which started today.   Assessment & Plan:   Principal Problem:   Toxic metabolic encephalopathy - due to dehydration and now improved.  Active Problems:   DM type 1 (diabetes mellitus, type 1) - resumed home dose of 70/30 at 12 U TID AC- sugars still in 300s- will increase to 16 U TID AC    Hypertensive urgency/ headache - BP 180-200/ 100-120 - he states his headaches for the past 3 days have been due to BP being high - cont Norvasc, add Labetalol 200 BID and Clonidine PRN  Back pain - acute, likely due to bed- allergic to NSAIDs- cont Tylenol   DVT prophylaxis: Lovenox Code Status: Full code Family Communication:  Disposition Plan: back to jail in 1-2 days Consultants:    Procedures:    Antimicrobials:  Anti-infectives    None       Objective: Vitals:   05/16/16 0646 05/16/16 0830 05/16/16 1115 05/16/16 1204  BP: (!) 189/110 (!) 149/110 (!) 166/118 (!) 145/108  Pulse: 87   78  Resp:      Temp:      TempSrc:      SpO2:      Weight:      Height:        Intake/Output Summary (Last 24 hours) at 05/16/16 1400 Last data filed at 05/16/16 1129  Gross per 24 hour  Intake           1687.5 ml  Output             1575 ml  Net            112.5 ml   Filed Weights   05/16/16 0117  Weight: 80.7 kg (177 lb 14.6 oz)     Examination: General exam: Appears comfortable  HEENT: PERRLA, oral mucosa moist, no sclera icterus or thrush Respiratory system: Clear to auscultation. Respiratory effort normal. Cardiovascular system: S1 & S2 heard, RRR.  No murmurs  Gastrointestinal system: Abdomen soft, non-tender, nondistended. Normal bowel sound. No organomegaly Central nervous system: Alert and oriented. No focal neurological deficits. Extremities: No cyanosis, clubbing or edema Skin: No rashes or ulcers Psychiatry:  Mood & affect appropriate.     Data Reviewed: I have personally reviewed following labs and imaging studies  CBC:  Recent Labs Lab 05/15/16 1600  WBC 8.1  NEUTROABS 5.2  HGB 20.3*  HCT 55.8*  MCV 80.9  PLT 274   Basic Metabolic Panel:  Recent Labs Lab 05/15/16 1600  NA 139  K 4.0  CL 102  CO2 26  GLUCOSE 126*  BUN 22*  CREATININE 1.17  CALCIUM 10.1   GFR: Estimated Creatinine Clearance: 96.7 mL/min (by C-G formula based on SCr of 1.17 mg/dL). Liver Function Tests:  Recent Labs Lab 05/15/16 1600  AST 23  ALT 28  ALKPHOS 64  BILITOT 1.3*  PROT 8.2*  ALBUMIN 4.3   No results for input(s): LIPASE, AMYLASE in the last 168 hours. No results for input(s): AMMONIA in the last 168 hours. Coagulation Profile: No results for input(s): INR, PROTIME in the last 168 hours. Cardiac Enzymes: No results for input(s): CKTOTAL, CKMB, CKMBINDEX, TROPONINI in the last 168 hours. BNP (last 3 results) No results for input(s): PROBNP in the last 8760 hours. HbA1C: No results for input(s): HGBA1C in the last 72 hours. CBG:  Recent Labs Lab 05/15/16 1503 05/15/16 2328 05/16/16 0107 05/16/16 0736 05/16/16 1205  GLUCAP 185* 230* 307* 397* 390*   Lipid Profile: No results for input(s): CHOL, HDL, LDLCALC, TRIG, CHOLHDL, LDLDIRECT in the last 72 hours. Thyroid Function Tests: No results for input(s): TSH, T4TOTAL, FREET4, T3FREE, THYROIDAB in the last 72 hours. Anemia  Panel: No results for input(s): VITAMINB12, FOLATE, FERRITIN, TIBC, IRON, RETICCTPCT in the last 72 hours. Urine analysis:    Component Value Date/Time   COLORURINE YELLOW 05/15/2016 1600   APPEARANCEUR CLOUDY (A) 05/15/2016 1600   LABSPEC 1.044 (H) 05/15/2016 1600   PHURINE 6.5 05/15/2016 1600   GLUCOSEU >1000 (A) 05/15/2016 1600   HGBUR NEGATIVE 05/15/2016 1600   BILIRUBINUR NEGATIVE 05/15/2016 1600   KETONESUR 15 (A) 05/15/2016 1600   PROTEINUR NEGATIVE 05/15/2016 1600   UROBILINOGEN 0.2 10/25/2014 1239   NITRITE NEGATIVE 05/15/2016 1600   LEUKOCYTESUR NEGATIVE 05/15/2016 1600   Sepsis Labs: @LABRCNTIP (procalcitonin:4,lacticidven:4) ) Recent Results (from the past 240 hour(s))  MRSA PCR Screening     Status: None   Collection Time: 05/16/16  1:16 AM  Result Value Ref Range Status   MRSA by PCR NEGATIVE NEGATIVE Final    Comment:        The GeneXpert MRSA Assay (FDA approved for NASAL specimens only), is one component of a comprehensive MRSA colonization surveillance program. It is not intended to diagnose MRSA infection nor to guide or monitor treatment for MRSA infections.          Radiology Studies: Ct Head Wo Contrast  Result Date: 05/15/2016 CLINICAL DATA:  Altered mental status/lethargy EXAM: CT HEAD WITHOUT CONTRAST TECHNIQUE: Contiguous axial images were obtained from the base of the skull through the vertex without intravenous contrast. COMPARISON:  March 26, 2016 FINDINGS: Brain: The ventricles are normal in size and configuration. There is no intracranial mass, hemorrhage, extra-axial fluid collection, or midline shift. Gray-white compartments appear normal. No acute infarct evident. Vascular: No hyperdense vessel evident. No vascular calcifications evident. Skull: The bony calvarium appears intact. Old fracture right nasal bone again noted. Sinuses/Orbits: There is mucosal thickening in several ethmoid air cells bilaterally. Other paranasal sinuses  appear clear. Orbits appear symmetric bilaterally. Other: Mastoid air cells are clear. IMPRESSION: Ethmoid sinus disease bilaterally. Old right nasal bone fracture. No intracranial mass hemorrhage, or extra-axial fluid collection. Gray-white compartments appear normal. Electronically Signed   By: Bretta BangWilliam  Woodruff III M.D.   On: 05/15/2016 17:03   Dg Chest Portable 1 View  Result Date: 05/15/2016 CLINICAL DATA:  Headache today. EXAM: PORTABLE CHEST 1 VIEW COMPARISON:  Single-view of the chest 04/02/2014. FINDINGS: Lungs are clear. Heart size is normal. No pneumothorax or pleural effusion. IMPRESSION: Negative chest. Electronically Signed   By: Drusilla Kannerhomas  Dalessio M.D.   On: 05/15/2016 15:53      Scheduled Meds: . amLODipine  10 mg Oral Daily  . enoxaparin (LOVENOX) injection  40 mg Subcutaneous Daily  . insulin aspart protamine- aspart  16 Units  Subcutaneous TID AC  . labetalol  200 mg Oral BID  . metFORMIN  500 mg Oral Q breakfast   Continuous Infusions:   LOS: 0 days    Time spent in minutes: 35    Amandajo Gonder, MD [facility] Pager: [email or web address] Password [email or web address] 05/16/2016, 2:00 PM

## 2016-05-16 NOTE — Progress Notes (Signed)
Inpatient Diabetes Program Recommendations  AACE/ADA: New Consensus Statement on Inpatient Glycemic Control (2015)  Target Ranges:  Prepandial:   less than 140 mg/dL      Peak postprandial:   less than 180 mg/dL (1-2 hours)      Critically ill patients:  140 - 180 mg/dL   Lab Results  Component Value Date   GLUCAP 390 (H) 05/16/2016   HGBA1C 9.9 (H) 04/03/2014    Review of Glycemic Control  Needs correction insulin. CBGs > 180 mg/dL 16/1070/30 increased to 16 units tidwc.  Inpatient Diabetes Program Recommendations:    Add Novolog sensitive tidwc and hs.  Will continue to follow. Thank you. Matthew [name], RD, LDN, CDE Inpatient Diabetes Coordinator [phone]

## 2016-05-16 NOTE — Progress Notes (Signed)
Pt nauseated and vomited small amount of undigested food. MD notified. SRP, RN

## 2016-05-16 NOTE — Progress Notes (Signed)
Pt. CBG 307 upon arrival on the floor. NP on call Schorr paged. No order for insulin coverage given. Will continue to monitor pt. Closely

## 2016-05-17 DIAGNOSIS — Z765 Malingerer [conscious simulation]: Secondary | ICD-10-CM

## 2016-05-17 DIAGNOSIS — E1165 Type 2 diabetes mellitus with hyperglycemia: Secondary | ICD-10-CM

## 2016-05-17 DIAGNOSIS — I160 Hypertensive urgency: Secondary | ICD-10-CM

## 2016-05-17 DIAGNOSIS — IMO0001 Reserved for inherently not codable concepts without codable children: Secondary | ICD-10-CM

## 2016-05-17 DIAGNOSIS — E860 Dehydration: Secondary | ICD-10-CM

## 2016-05-17 DIAGNOSIS — Z794 Long term (current) use of insulin: Secondary | ICD-10-CM

## 2016-05-17 LAB — GLUCOSE, CAPILLARY
GLUCOSE-CAPILLARY: 140 mg/dL — AB (ref 65–99)
GLUCOSE-CAPILLARY: 250 mg/dL — AB (ref 65–99)
Glucose-Capillary: 310 mg/dL — ABNORMAL HIGH (ref 65–99)

## 2016-05-17 MED ORDER — INSULIN ASPART PROT & ASPART (70-30 MIX) 100 UNIT/ML ~~LOC~~ SUSP
16.0000 [IU] | Freq: Three times a day (TID) | SUBCUTANEOUS | Status: DC
  Administered 2016-05-17: 16 [IU] via SUBCUTANEOUS

## 2016-05-17 MED ORDER — METOCLOPRAMIDE HCL 10 MG PO TABS: 10.0000 mg | ORAL_TABLET | Freq: Four times a day (QID) | ORAL | 0 refills | Status: AC | PRN

## 2016-05-17 MED ORDER — METOCLOPRAMIDE HCL 5 MG/ML IJ SOLN
10.0000 mg | Freq: Four times a day (QID) | INTRAMUSCULAR | Status: DC
  Administered 2016-05-17: 10 mg via INTRAVENOUS
  Filled 2016-05-17: qty 2

## 2016-05-17 MED ORDER — INSULIN ASPART PROT & ASPART (70-30 MIX) 100 UNIT/ML ~~LOC~~ SUSP
16.0000 [IU] | Freq: Once | SUBCUTANEOUS | Status: AC
  Administered 2016-05-17: 16 [IU] via SUBCUTANEOUS
  Filled 2016-05-17: qty 10

## 2016-05-17 MED ORDER — INSULIN ASPART PROT & ASPART (70-30 MIX) 100 UNIT/ML ~~LOC~~ SUSP: 20.0000 [IU] | Freq: Three times a day (TID) | SUBCUTANEOUS | 11 refills | Status: AC

## 2016-05-17 MED ORDER — LABETALOL HCL 200 MG PO TABS: 200.0000 mg | ORAL_TABLET | Freq: Two times a day (BID) | ORAL | 0 refills | Status: AC

## 2016-05-17 MED ORDER — MAGNESIUM SULFATE 2 GM/50ML IV SOLN
2.0000 g | Freq: Once | INTRAVENOUS | Status: AC
  Administered 2016-05-17: 2 g via INTRAVENOUS
  Filled 2016-05-17: qty 50

## 2016-05-17 NOTE — Discharge Summary (Signed)
Physician Discharge Summary  Matthew [name] ZOX:[id number] DOB: 1980/10/15 DOA: 05/15/2016  PCP: No PCP Per Patient  Admit date: 05/15/2016 Discharge date: 05/17/2016  Ad[facility] From: jail  Disposition:  Return to jail   Recommendations for Outpatient Follow-up:  1. Need PCP and endocrinologist   Discharge Condition:  stable   CODE STATUS:  Full code   Diet recommendation:  Diabetic, low sodium, heart healthy Consultations:      Discharge Diagnoses:  Principal Problem:   Toxic metabolic encephalopathy Active Problems:   DM type 2 (diabetes mellitus, type 1) (HCC)   Hypertensive urgency    Subjective: He was sleeping when I entered the room but when awakened, complained of a severe headache on the top of his head.   Brief Summary: Matthew LohJovie Ingramis a 35 y.o.malewith medical history significant of DM1, HTN. [facility] not [facility] meds for the past five days, eating or drinking, ([facility] Patient initially tachycardic and altered in ED. Mental status improved after 3L IVF bolus in ED. CBG was 120, last insulin dose 1130 at jail. Found to be dehydrated.   Hospital Course:  Principal Problem:   Toxic metabolic encephalopathy?? - claiming that the officers are harming him although none of this has been witnessed, claims nurses are not lying and not recording his BP appropriately, claims to have severe headaches but spend most of the day sleeping - suspect malingering to avoid being discharged from the hospital   Active Problems:   DM type 1 (diabetes mellitus, type 1) uncontrolled - resumed home dose of 70/30 at 12 U TID AC- sugars still in 300s-have increased to 20 U with meals - can give 12 U when if he misses a meal    Hypertensive urgency  - BP 180-200/ 100-120- improving to 135/90 (checked when he was drifting off to sleep- when he is awake, he  purposefully contract his arm/ shoulder when BP is being checked_ - cont Norvasc, added Labetalol 200 BID - BP improved significantly  Dehydration  - on admission due to hyperglycemia and refusing his insulin - resolved with IVF- has been eating and drinking well  Headaches -- he states his headaches for the past 3 days have been due to BP being high- CT head unrevealing - mainly complains of a headache on the top of his head when I check on him- does not complain of this to RN and spends most of the time sleeping   Back pain - acute, likely due to bed- allergic to NSAIDs- cont Tylenol  Discharge Instructions  Discharge Instructions    Diet - low sodium heart healthy    Complete by:  As directed    Diet Carb Modified    Complete by:  As directed    Increase activity slowly    Complete by:  As directed        Medication List    STOP taking these medications   HUMULIN 70/30 (70-30) 100 UNIT/ML injection Generic drug:  insulin NPH-regular Human   insulin regular 250 units/2.795mL (100 units/mL) injection Commonly known as:  NOVOLIN R,HUMULIN R     TAKE these medications   amLODipine 10 MG tablet Commonly known as:  NORVASC Take 10 mg by mouth daily.   insulin aspart protamine- aspart (70-30) 100 UNIT/ML injection Commonly known as:  NOVOLOG MIX 70/30 Inject 0.2 mLs (20 Units total) into the skin 3 (three)  times daily before meals. If he does not eat, give him 12 U instead of 20 U   labetalol 200 MG tablet Commonly known as:  NORMODYNE Take 1 tablet (200 mg total) by mouth 2 (two) times daily.   metFORMIN 500 MG 24 hr tablet Commonly known as:  GLUCOPHAGE-XR Take 500 mg by mouth daily with breakfast.   metoCLOPramide 10 MG tablet Commonly known as:  REGLAN Take 1 tablet (10 mg total) by mouth every 6 (six) hours as needed for nausea.       Allergies  Allergen Reactions  . Ibuprofen Hives    itching     Procedures/Studies:    Ct Head Wo  Contrast  Result Date: 05/15/2016 CLINICAL DATA:  Altered mental status/lethargy EXAM: CT HEAD WITHOUT CONTRAST TECHNIQUE: Contiguous axial images were obtained from the base of the skull through the vertex without intravenous contrast. COMPARISON:  March 26, 2016 FINDINGS: Brain: The ventricles are normal in size and configuration. There is no intracranial mass, hemorrhage, extra-axial fluid collection, or midline shift. Gray-white compartments appear normal. No acute infarct evident. Vascular: No hyperdense vessel evident. No vascular calcifications evident. Skull: The bony calvarium appears intact. Old fracture right nasal bone again noted. Sinuses/Orbits: There is mucosal thickening in several ethmoid air cells bilaterally. Other paranasal sinuses appear clear. Orbits appear symmetric bilaterally. Other: Mastoid air cells are clear. IMPRESSION: Ethmoid sinus disease bilaterally. Old right nasal bone fracture. No intracranial mass hemorrhage, or extra-axial fluid collection. Gray-white compartments appear normal. Electronically Signed   By: Bretta Bang III M.D.   On: 05/15/2016 17:03   Dg Chest Portable 1 View  Result Date: 05/15/2016 CLINICAL DATA:  Headache today. EXAM: PORTABLE CHEST 1 VIEW COMPARISON:  Single-view of the chest 04/02/2014. FINDINGS: Lungs are clear. Heart size is normal. No pneumothorax or pleural effusion. IMPRESSION: Negative chest. Electronically Signed   By: Drusilla Kanner M.D.   On: 05/15/2016 15:53        Discharge Exam: Vitals:   05/17/16 0855 05/17/16 1137  BP: (!) 160/98 135/90  Pulse:  97  Resp:  18  Temp:     Vitals:   05/17/16 0700 05/17/16 0854 05/17/16 0855 05/17/16 1137  BP: (!) 165/118 (!) 160/100 (!) 160/98 135/90  Pulse:    97  Resp:    18  Temp:      TempSrc:      SpO2:      Weight:      Height:        General: Pt is alert, awake, not in acute distress Cardiovascular: RRR, S1/S2 +, no rubs, no gallops Respiratory: CTA  bilaterally, no wheezing, no rhonchi Abdominal: Soft, NT, ND, bowel sounds + Extremities: no edema, no cyanosis    The results of significant diagnostics from this hospitalization (including imaging, microbiology, ancillary and laboratory) are listed below for reference.     Microbiology: Recent Results (from the past 240 hour(s))  MRSA PCR Screening     Status: None   Collection Time: 05/16/16  1:16 AM  Result Value Ref Range Status   MRSA by PCR NEGATIVE NEGATIVE Final    Comment:        The GeneXpert MRSA Assay (FDA approved for NASAL specimens only), is one component of a comprehensive MRSA colonization surveillance program. It is not intended to diagnose MRSA infection nor to guide or monitor treatment for MRSA infections.      Labs: BNP (last 3 results) No results for input(s): BNP in the last 8760  hours. Basic Metabolic Panel:  Recent Labs Lab 05/15/16 1600 05/16/16 1726  NA 139 134*  K 4.0 4.1  CL 102 101  CO2 26 27  GLUCOSE 126* 293*  BUN 22* 15  CREATININE 1.17 1.20  CALCIUM 10.1 8.7*   Liver Function Tests:  Recent Labs Lab 05/15/16 1600  AST 23  ALT 28  ALKPHOS 64  BILITOT 1.3*  PROT 8.2*  ALBUMIN 4.3   No results for input(s): LIPASE, AMYLASE in the last 168 hours. No results for input(s): AMMONIA in the last 168 hours. CBC:  Recent Labs Lab 05/15/16 1600  WBC 8.1  NEUTROABS 5.2  HGB 20.3*  HCT 55.8*  MCV 80.9  PLT 274   Cardiac Enzymes: No results for input(s): CKTOTAL, CKMB, CKMBINDEX, TROPONINI in the last 168 hours. BNP: Invalid input(s): POCBNP CBG:  Recent Labs Lab 05/16/16 1613 05/16/16 2214 05/17/16 0744 05/17/16 1210 05/17/16 1438  GLUCAP 322* 92 140* 310* 250*   D-Dimer No results for input(s): DDIMER in the last 72 hours. Hgb A1c No results for input(s): HGBA1C in the last 72 hours. Lipid Profile No results for input(s): CHOL, HDL, LDLCALC, TRIG, CHOLHDL, LDLDIRECT in the last 72 hours. Thyroid  function studies No results for input(s): TSH, T4TOTAL, T3FREE, THYROIDAB in the last 72 hours.  Invalid input(s): FREET3 Anemia work up No results for input(s): VITAMINB12, FOLATE, FERRITIN, TIBC, IRON, RETICCTPCT in the last 72 hours. Urinalysis    Component Value Date/Time   COLORURINE YELLOW 05/15/2016 1600   APPEARANCEUR CLOUDY (A) 05/15/2016 1600   LABSPEC 1.044 (H) 05/15/2016 1600   PHURINE 6.5 05/15/2016 1600   GLUCOSEU >1000 (A) 05/15/2016 1600   HGBUR NEGATIVE 05/15/2016 1600   BILIRUBINUR NEGATIVE 05/15/2016 1600   KETONESUR 15 (A) 05/15/2016 1600   PROTEINUR NEGATIVE 05/15/2016 1600   UROBILINOGEN 0.2 10/25/2014 1239   NITRITE NEGATIVE 05/15/2016 1600   LEUKOCYTESUR NEGATIVE 05/15/2016 1600   Sepsis Labs Invalid input(s): PROCALCITONIN,  WBC,  LACTICIDVEN Microbiology Recent Results (from the past 240 hour(s))  MRSA PCR Screening     Status: None   Collection Time: 05/16/16  1:16 AM  Result Value Ref Range Status   MRSA by PCR NEGATIVE NEGATIVE Final    Comment:        The GeneXpert MRSA Assay (FDA approved for NASAL specimens only), is one component of a comprehensive MRSA colonization surveillance program. It is not intended to diagnose MRSA infection nor to guide or monitor treatment for MRSA infections.      Time coordinating discharge: Over 30 minutes  SIGNED:   Calvert CantorIZWAN,Ichelle Harral, MD  [facility] 05/17/2016, 3:04 PM Pager   If 7PM-7AM, please contact night-coverage [email or web address] Password [email or web address]

## 2016-05-17 NOTE — Progress Notes (Signed)
Pt CBG is 250 he request BP to be taken, pt constricts his arm when the BP is taking and he is slightly moving constantly when the BP machine is moving, I have asked pt to relax and the most accurate reading is when he is not moving around  while the BP.

## 2016-05-17 NOTE — Progress Notes (Signed)
[facility]

## 2016-05-17 NOTE — Progress Notes (Signed)
Pt BP decreased when taking while pt dozing off to sleep and the effectiveness of BP med administered as prescribled, in past times, pt sometimes becomes very rigid and tense when taking BP; staff Encouraging/coachng  pt to relax while staff take BP. Will cont to monitor. SRP, RN

## 2017-02-03 DIAGNOSIS — F1910 Other psychoactive substance abuse, uncomplicated: Secondary | ICD-10-CM

## 2017-02-03 DIAGNOSIS — E1310 Other specified diabetes mellitus with ketoacidosis without coma: Secondary | ICD-10-CM

## 2017-02-03 DIAGNOSIS — Z720 Tobacco use: Secondary | ICD-10-CM

## 2017-02-03 DIAGNOSIS — Z9119 Patient's noncompliance with other medical treatment and regimen: Secondary | ICD-10-CM

## 2017-02-04 DIAGNOSIS — I10 Essential (primary) hypertension: Secondary | ICD-10-CM

## 2018-07-30 IMAGING — CT CT HEAD W/O CM
3 of 4 series · 15 of 47 positions shown, 18 images · non-contrast
Comparison: March 26, 2016

CLINICAL DATA: Altered mental status/lethargy

EXAM:
CT HEAD WITHOUT CONTRAST
TECHNIQUE: Contiguous axial images were obtained from the base of the skull
through the vertex without intravenous contrast.

[Series 2: head w/o · axial · non-contrast · 0.52mm/px · z∈[-346,-216]mm · 9 of 34 slices shown, 12 images]
[im 4/34  brain]
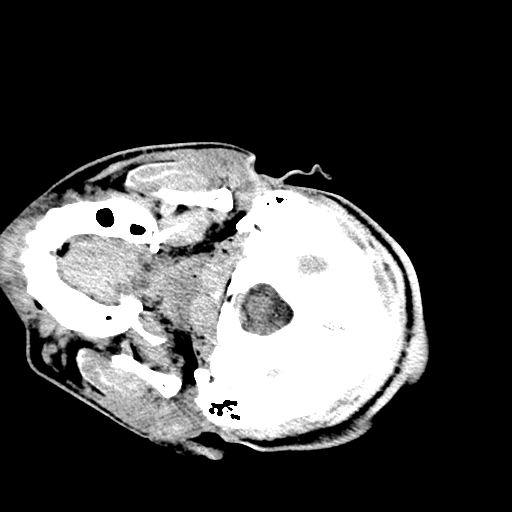
[im 4/34  bone]
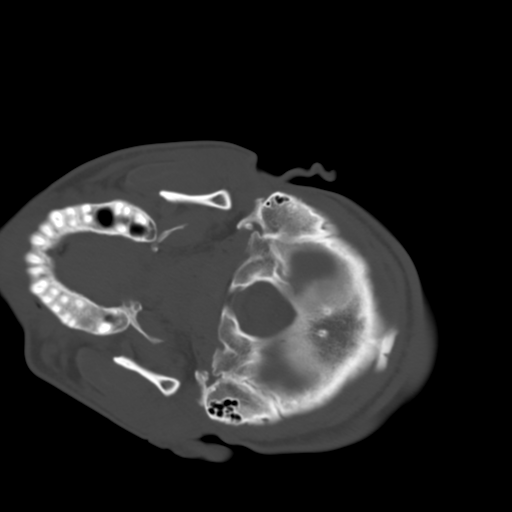
[im 7/34  brain]
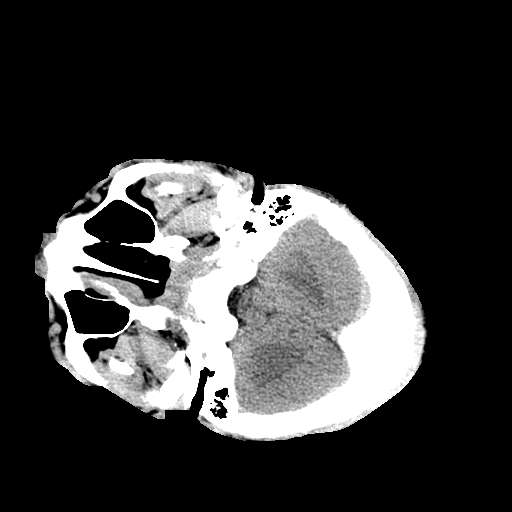
[im 10/34  brain]
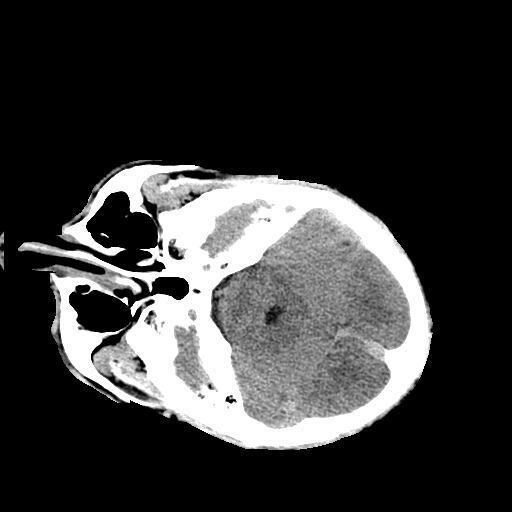
[im 14/34  brain]
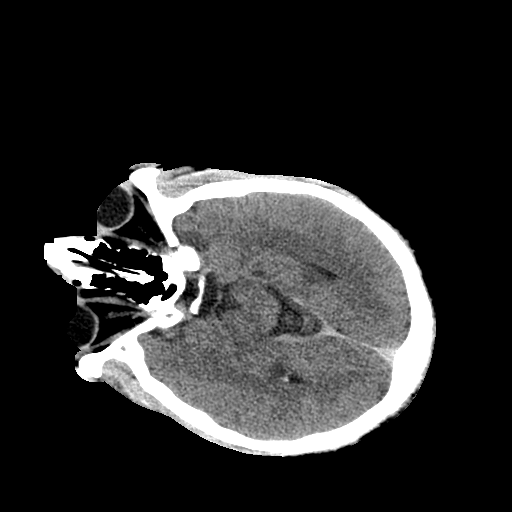
[im 17/34  brain]
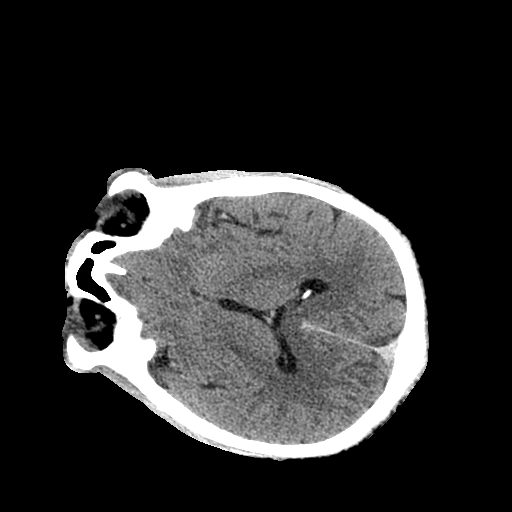
[im 17/34  bone]
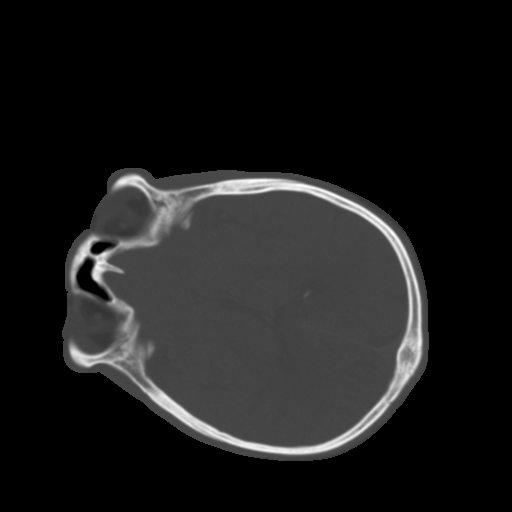
[im 20/34  brain]
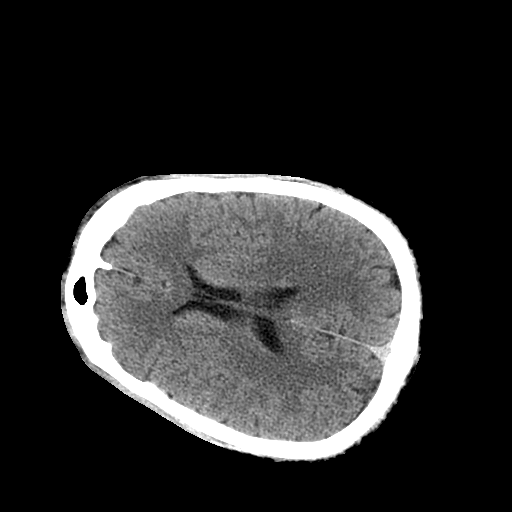
[im 24/34  brain]
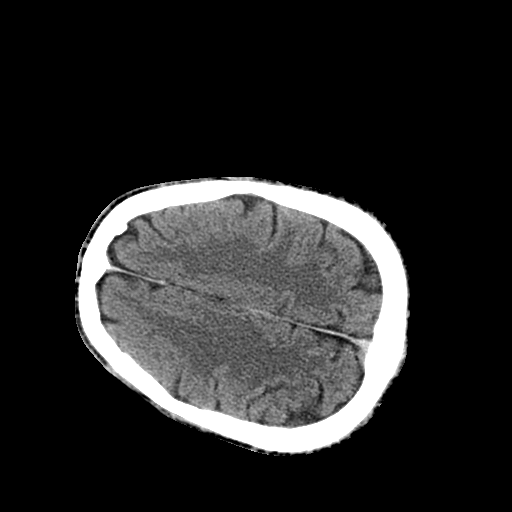
[im 27/34  brain]
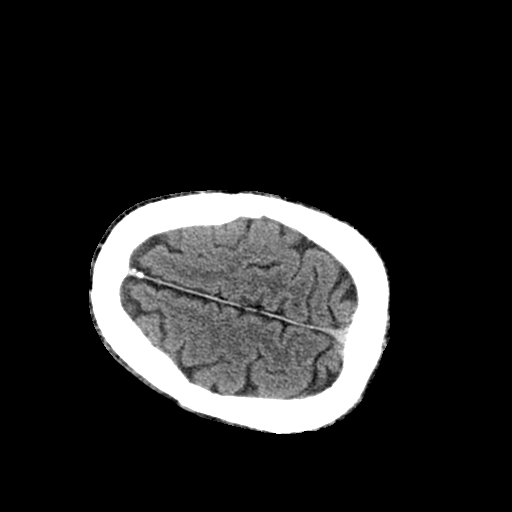
[im 30/34  brain]
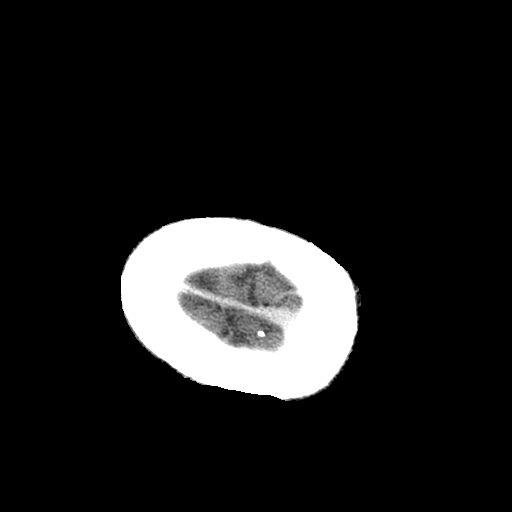
[im 30/34  bone]
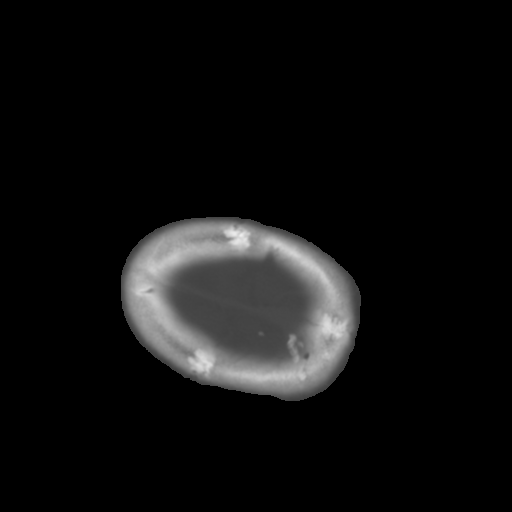

[Series 4: coronal · coronal · 0.38mm/px · 3 of 73 slices shown]
[im 25/73  brain]
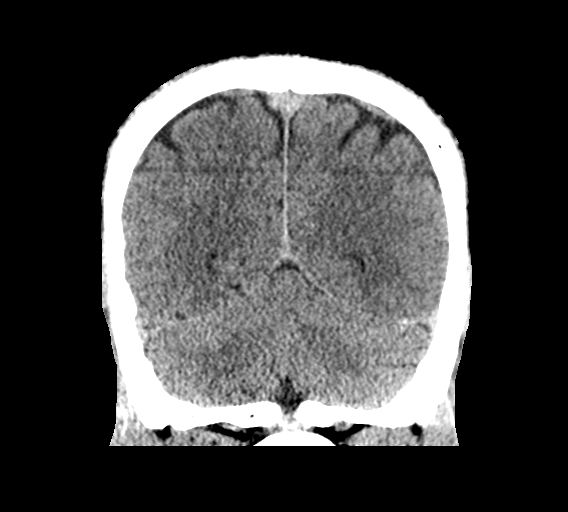
[im 33/73  brain]
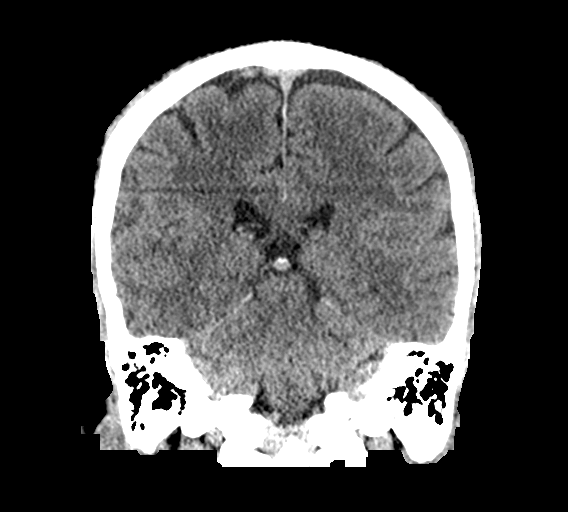
[im 41/73  brain]
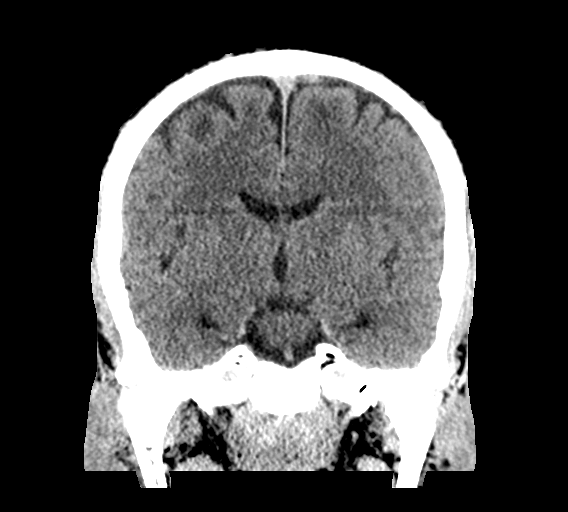

[Series 5: sagittal · sagittal · 0.38mm/px · 3 of 53 slices shown]
[im 18/53  brain]
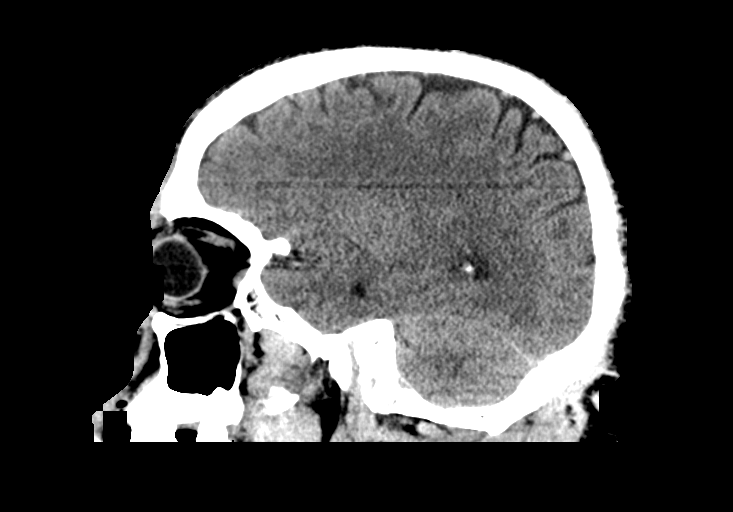
[im 27/53  brain]
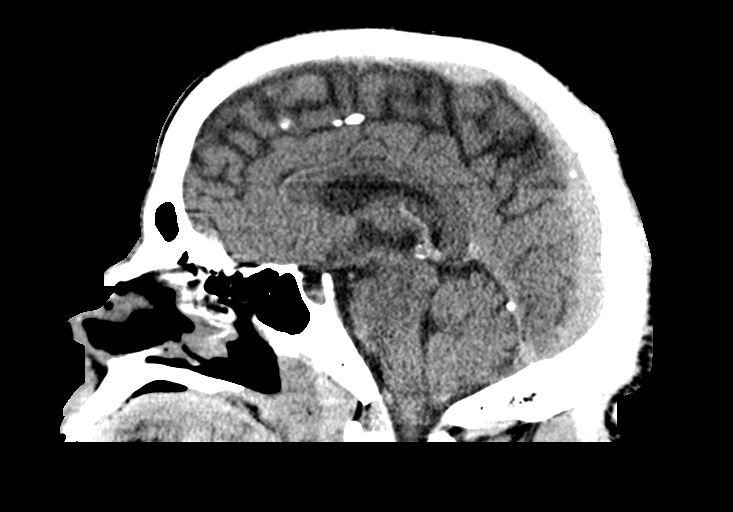
[im 35/53  brain]
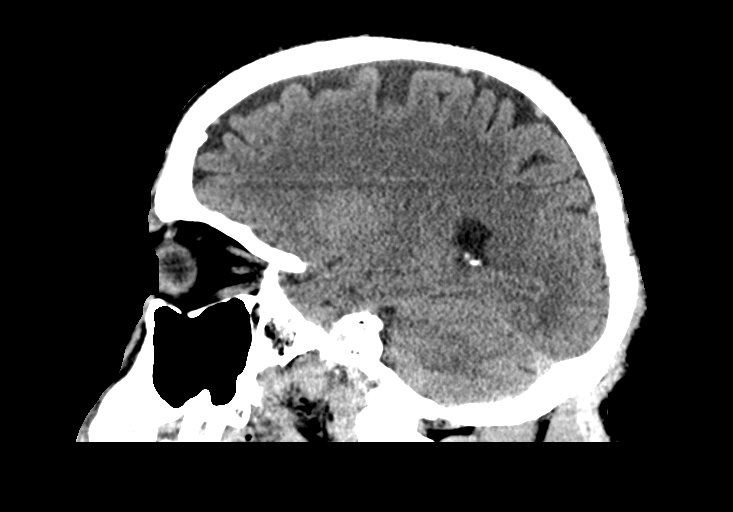

[15 of 47 positions shown; findings below may reference images not displayed]

FINDINGS: Brain: The ventricles are normal in size and configuration. There is
no intracranial mass, hemorrhage, extra-axial fluid collection, or
midline shift. Gray-white compartments appear normal. No acute
infarct evident.

Vascular: No hyperdense vessel evident. No vascular calcifications
evident.

Skull: The bony calvarium appears intact. Old fracture right nasal
bone again noted.

Sinuses/Orbits: There is mucosal thickening in several ethmoid air
cells bilaterally. Other paranasal sinuses appear clear. Orbits
appear symmetric bilaterally.

Other: Mastoid air cells are clear.
IMPRESSION: Ethmoid sinus disease bilaterally. Old right nasal bone fracture. No
intracranial mass hemorrhage, or extra-axial fluid collection.
Gray-white compartments appear normal.

## 2018-07-30 IMAGING — DX DG CHEST 1V PORT
1 series · 1 of 1 positions shown · non-contrast
Comparison: Single-view of the chest 04/02/2014.

CLINICAL DATA: Headache today.

EXAM:
PORTABLE CHEST 1 VIEW

[chest ap]
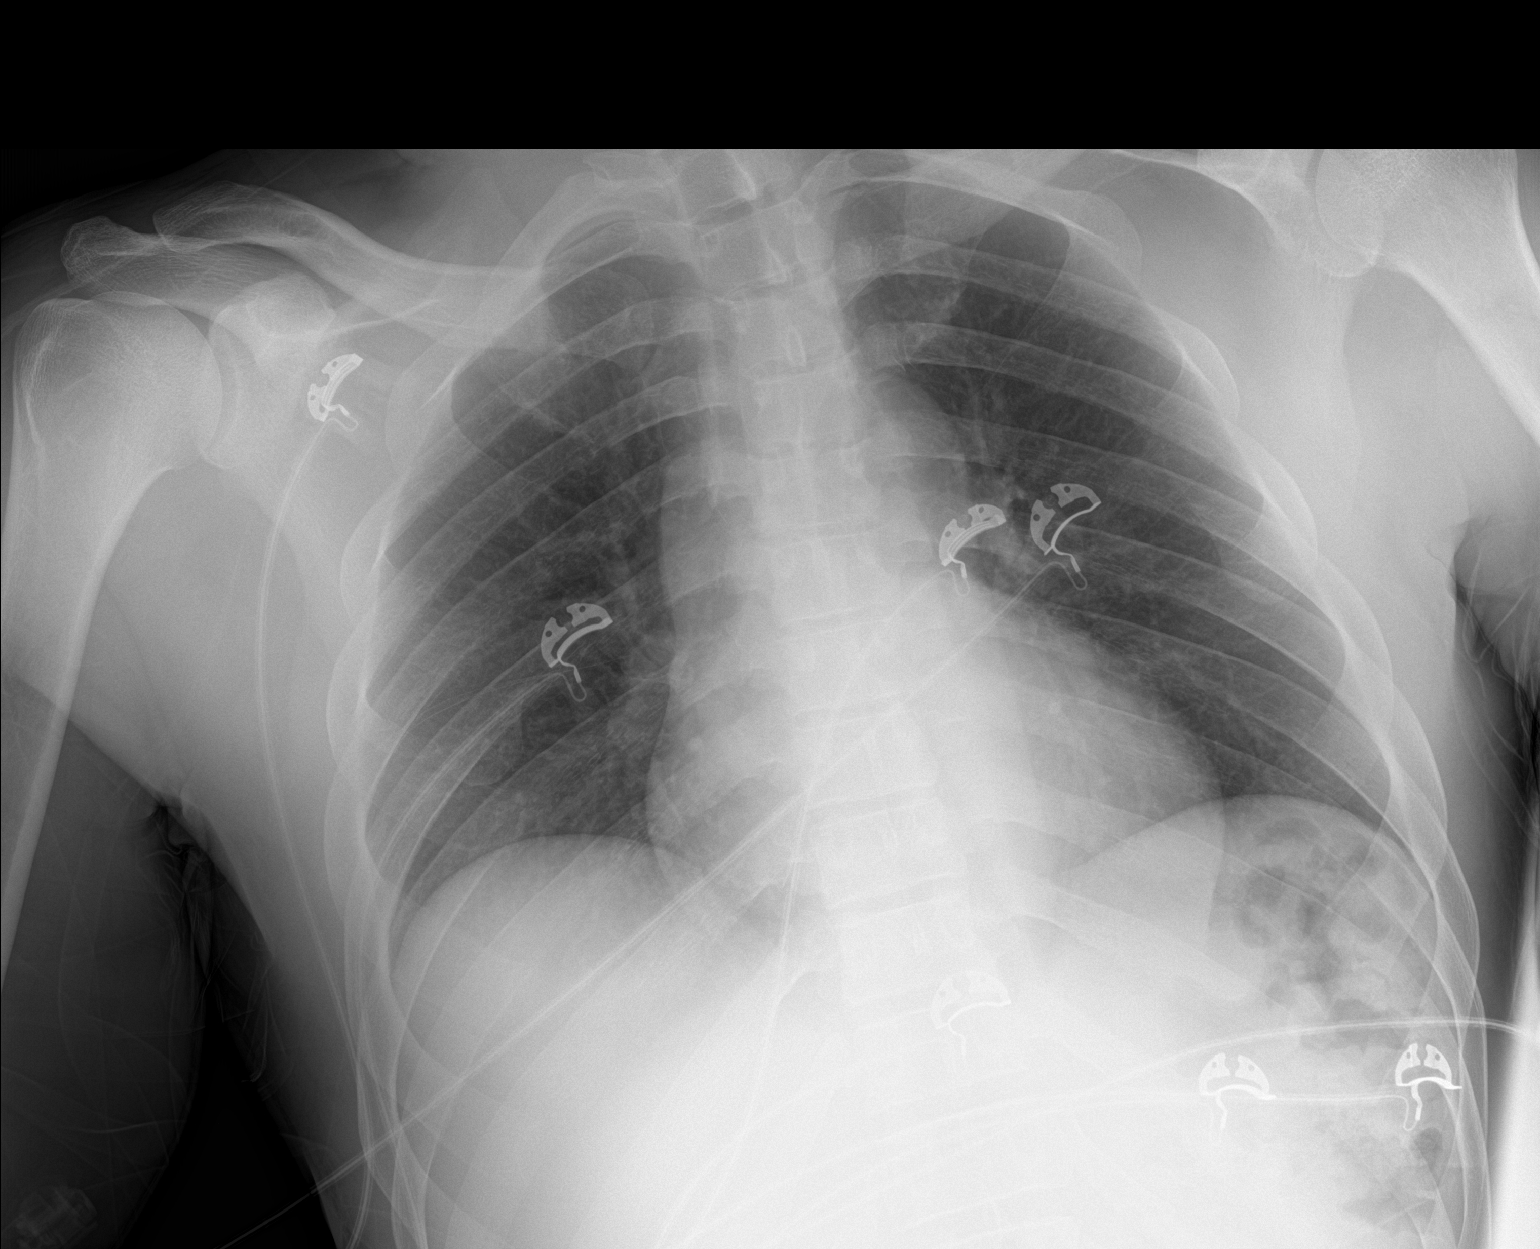

[1 of 1 positions shown; findings below may reference images not displayed]

FINDINGS: Lungs are clear. Heart size is normal. No pneumothorax or pleural
effusion.
IMPRESSION: Negative chest.

## 2018-11-14 ENCOUNTER — Encounter (HOSPITAL_COMMUNITY): Payer: Self-pay | Admitting: *Deleted

## 2018-11-14 ENCOUNTER — Emergency Department (HOSPITAL_COMMUNITY)
Admission: EM | Admit: 2018-11-14 | Discharge: 2018-11-15 | Disposition: A | Discharge status: Home / Self Care | Attending: Emergency Medicine | Admitting: Emergency Medicine

## 2018-11-14 ENCOUNTER — Emergency Department (HOSPITAL_COMMUNITY)

## 2018-11-14 ENCOUNTER — Other Ambulatory Visit: Payer: Self-pay

## 2018-11-14 DIAGNOSIS — K047 Periapical abscess without sinus: Secondary | ICD-10-CM | POA: Insufficient documentation

## 2018-11-14 DIAGNOSIS — F17210 Nicotine dependence, cigarettes, uncomplicated: Secondary | ICD-10-CM | POA: Diagnosis not present

## 2018-11-14 DIAGNOSIS — E119 Type 2 diabetes mellitus without complications: Secondary | ICD-10-CM | POA: Insufficient documentation

## 2018-11-14 DIAGNOSIS — K130 Diseases of lips: Secondary | ICD-10-CM

## 2018-11-14 DIAGNOSIS — R220 Localized swelling, mass and lump, head: Secondary | ICD-10-CM | POA: Diagnosis present

## 2018-11-14 LAB — CBC WITH DIFFERENTIAL/PLATELET
Abs Immature Granulocytes: 0.04 10*3/uL (ref 0.00–0.07)
Basophils Absolute: 0 10*3/uL (ref 0.0–0.1)
Basophils Relative: 0 %
Eosinophils Absolute: 0.1 10*3/uL (ref 0.0–0.5)
Eosinophils Relative: 1 %
HCT: 46.8 % (ref 39.0–52.0)
Hemoglobin: 15.1 g/dL (ref 13.0–17.0)
Immature Granulocytes: 0 %
Lymphocytes Relative: 16 %
Lymphs Abs: 1.9 10*3/uL (ref 0.7–4.0)
MCH: 25.1 pg — ABNORMAL LOW (ref 26.0–34.0)
MCHC: 32.3 g/dL (ref 30.0–36.0)
MCV: 77.7 fL — ABNORMAL LOW (ref 80.0–100.0)
Monocytes Absolute: 1 10*3/uL (ref 0.1–1.0)
Monocytes Relative: 9 %
Neutro Abs: 8.7 10*3/uL — ABNORMAL HIGH (ref 1.7–7.7)
Neutrophils Relative %: 74 %
Platelets: 328 10*3/uL (ref 150–400)
RBC: 6.02 MIL/uL — ABNORMAL HIGH (ref 4.22–5.81)
RDW: 16.9 % — ABNORMAL HIGH (ref 11.5–15.5)
WBC: 11.7 10*3/uL — ABNORMAL HIGH (ref 4.0–10.5)
nRBC: 0 % (ref 0.0–0.2)

## 2018-11-14 LAB — COMPREHENSIVE METABOLIC PANEL
ALT: 11 U/L (ref 0–44)
AST: 10 U/L — ABNORMAL LOW (ref 15–41)
Albumin: 4 g/dL (ref 3.5–5.0)
Alkaline Phosphatase: 65 U/L (ref 38–126)
Anion gap: 11 (ref 5–15)
BUN: 11 mg/dL (ref 6–20)
CO2: 27 mmol/L (ref 22–32)
Calcium: 8.9 mg/dL (ref 8.9–10.3)
Chloride: 97 mmol/L — ABNORMAL LOW (ref 98–111)
Creatinine, Ser: 1.15 mg/dL (ref 0.61–1.24)
GFR calc Af Amer: >60 mL/min (ref >60)
GFR calc non Af Amer: >60 mL/min (ref >60)
Glucose, Bld: 351 mg/dL — ABNORMAL HIGH (ref 70–99)
Potassium: 3.5 mmol/L (ref 3.5–5.1)
Sodium: 135 mmol/L (ref 135–145)
Total Bilirubin: 0.6 mg/dL (ref 0.3–1.2)
Total Protein: 7.9 g/dL (ref 6.5–8.1)

## 2018-11-14 MED ORDER — LIDOCAINE-EPINEPHRINE (PF) 2 %-1:200000 IJ SOLN: 20.0000 mL | Freq: Once | INTRAMUSCULAR | Status: DC

## 2018-11-14 MED ORDER — FENTANYL CITRATE (PF) 100 MCG/2ML IJ SOLN
100.0000 ug | Freq: Once | INTRAMUSCULAR | Status: AC
  Administered 2018-11-14: 100 ug via INTRAVENOUS
  Filled 2018-11-14: qty 2

## 2018-11-14 MED ORDER — BUPIVACAINE HCL (PF) 0.5 % IJ SOLN
10.0000 mL | Freq: Once | INTRAMUSCULAR | Status: AC
  Administered 2018-11-14: 10 mL
  Filled 2018-11-14: qty 30

## 2018-11-14 MED ORDER — POVIDONE-IODINE 10 % EX SOLN
CUTANEOUS | Status: AC
  Administered 2018-11-14: 2
  Filled 2018-11-14: qty 30

## 2018-11-14 MED ORDER — KETOROLAC TROMETHAMINE 15 MG/ML IJ SOLN
15.0000 mg | Freq: Once | INTRAMUSCULAR | Status: AC
  Administered 2018-11-14: 15 mg via INTRAVENOUS
  Filled 2018-11-14: qty 1

## 2018-11-14 MED ORDER — CLINDAMYCIN HCL 300 MG PO CAPS: 300.0000 mg | ORAL_CAPSULE | Freq: Three times a day (TID) | ORAL | 0 refills | Status: AC

## 2018-11-14 MED ORDER — ACETAMINOPHEN ER 650 MG PO TBCR: 650.0000 mg | EXTENDED_RELEASE_TABLET | Freq: Three times a day (TID) | ORAL | 0 refills | Status: AC

## 2018-11-14 MED ORDER — CLINDAMYCIN HCL 150 MG PO CAPS
600.0000 mg | ORAL_CAPSULE | Freq: Once | ORAL | Status: AC
  Administered 2018-11-14: 600 mg via ORAL
  Filled 2018-11-14: qty 4

## 2018-11-14 MED ORDER — CLINDAMYCIN HCL 150 MG PO CAPS: 600.0000 mg | ORAL_CAPSULE | Freq: Once | ORAL | Status: DC

## 2018-11-14 MED ORDER — IOHEXOL 300 MG/ML  SOLN
75.0000 mL | Freq: Once | INTRAMUSCULAR | Status: AC | PRN
  Administered 2018-11-14: 75 mL via INTRAVENOUS

## 2018-11-14 NOTE — ED Notes (Signed)
Patient transported to CT 

## 2018-11-14 NOTE — ED Notes (Signed)
Patient back from CT scan.

## 2018-11-14 NOTE — ED Triage Notes (Signed)
Pt with bottom lip swelling for 4 days per pt.  Pt denies any injury, denies anything new.

## 2018-11-14 NOTE — Discharge Instructions (Addendum)
You have an abscess of your lip that was drained in the emergency room. Additionally you have an abscess at the base of your tooth that will need further evaluation by dentist.  Start taking the antibiotics that are prescribed and follow-up with a dentist in 1 week.  You might need your tooth removed. Also, stop taking prednisone.  Share the following CT findings with the dentist: IMPRESSION: Periapical lucency at the root of tooth 24 is the likely source of small abscess within the midline lower lip.  Return to the ER immediately if your symptoms get worse, but we expect that the lip would heal after the pus has been drained.

## 2018-11-15 MED ORDER — OXYCODONE-ACETAMINOPHEN 5-325 MG PO TABS
1.0000 | ORAL_TABLET | Freq: Once | ORAL | Status: AC
  Administered 2018-11-15: 1 via ORAL
  Filled 2018-11-15: qty 1

## 2018-11-15 MED ORDER — AMLODIPINE BESYLATE 5 MG PO TABS
10.0000 mg | ORAL_TABLET | Freq: Once | ORAL | Status: AC
  Administered 2018-11-15: 10 mg via ORAL
  Filled 2018-11-15: qty 2

## 2018-11-15 NOTE — ED Notes (Signed)
Patient discharge instructions given to officer along with prescriptions and verbally read to patient. Officer signed as guardian and patient signed as witness due to patient in custody of the officer. Full report given to [employer]. Patient discharged with ice pack for swelling.

## 2018-11-15 NOTE — ED Provider Notes (Signed)
[facility]   CSN: [id number] Arrival date & time: 11/14/18  2028    History   Chief Complaint Chief Complaint  Patient presents with  . Oral Swelling    HPI [name] is a 38 y.o. male.     HPI  38 year old male comes in a chief complaint of lip swelling. Patient has history of diabetes and is incarcerated.  He reports about 3 or 4 days ago he started having swelling in his lower lip.  He was seen by the prison medical team and started on prednisone and Benadryl.  Patient states that over time the swelling is only progressed, he was advised to come to the ER.  He is complaining of mild toothache and discomfort over the submandibular region.  He denies any associated shortness of breath, difficulty swallowing.  Past Medical History:  Diagnosis Date  . Diabetes mellitus without complication (HCC)   . DM type 1 (diabetes mellitus, type 1) (HCC) 2010   diagnosed in ED at [facility], started on insulin    Patient Active Problem List   Diagnosis Date Noted  . Dehydration 05/17/2016  . Diabetes mellitus, insulin dependent (IDDM), uncontrolled (HCC) 05/17/2016  . Hypertensive urgency 05/17/2016  . Malingering 05/17/2016  . Acute respiratory failure with hypoxia (HCC) 04/06/2014  . Tobacco abuse 04/06/2014  . Cocaine abuse (HCC) 04/06/2014  . DKA (diabetic ketoacidoses) (HCC) 04/02/2014  . Onychomycosis of toenail 05/03/2011  . Inadequate material resources 05/03/2011    History reviewed. No pertinent surgical history.      Home Medications    Prior to Admission medications   Medication Sig Start Date End Date Taking? Authorizing Provider  acetaminophen (TYLENOL 8 HOUR) 650 MG CR tablet Take 1 tablet (650 mg total) by mouth every 8 (eight) hours. 11/14/18   Derwood Kaplan, MD  amLODipine (NORVASC) 10 MG tablet Take 10 mg by mouth daily.    [provider]  clindamycin (CLEOCIN) 300 MG capsule Take 1 capsule  (300 mg total) by mouth 3 (three) times daily. 11/14/18   Derwood Kaplan, MD  insulin aspart protamine- aspart (NOVOLOG MIX 70/30) (70-30) 100 UNIT/ML injection Inject 0.2 mLs (20 Units total) into the skin 3 (three) times daily before meals. If he does not eat, give him 12 U instead of 20 U 05/17/16   Calvert Cantor, MD  labetalol (NORMODYNE) 200 MG tablet Take 1 tablet (200 mg total) by mouth 2 (two) times daily. 05/17/16   Calvert Cantor, MD  metFORMIN (GLUCOPHAGE-XR) 500 MG 24 hr tablet Take 500 mg by mouth daily with breakfast.    [provider]  metoCLOPramide (REGLAN) 10 MG tablet Take 1 tablet (10 mg total) by mouth every 6 (six) hours as needed for nausea. 05/17/16   Calvert Cantor, MD    Family History Family History  Problem Relation Age of Onset  . Hypertension Father     Social History Social History   Tobacco Use  . Smoking status: Current Every Day Smoker    Packs/day: 1.00    Years: 15.00    Pack years: 15.00    Types: Cigarettes  . Smokeless tobacco: Never Used  Substance Use Topics  . Alcohol use: Yes    Comment: occasionally - beer   . Drug use: Yes    Types: Cocaine, Marijuana    Comment: marijuana - few times a week      Allergies   Ibuprofen   Review of Systems Review of Systems  Constitutional: Positive for activity change. Negative for fever.  HENT: Negative for trouble swallowing.   Respiratory: Negative for shortness of breath.   Gastrointestinal: Negative for nausea and vomiting.  Allergic/Immunologic: Negative for immunocompromised state.  All other systems reviewed and are negative.    Physical Exam Updated Vital Signs BP (!) 175/113   Pulse 95   Temp 98.7 F (37.1 C)   Resp 18   Ht 5\' 8"  (1.727 m)   Wt 89.8 kg   SpO2 100%   BMI 30.11 kg/m   Physical Exam Vitals signs and nursing note reviewed.  Constitutional:      Appearance: He is well-developed.  HENT:     Head: Atraumatic.     Mouth/Throat:     Comments:  Patient has tenderness over tooth #20-24, there is no gingival abscess.  Patient has no trismus or stridor. The lower lip is significantly swollen with fluctuance.  There is also a white top/exudate on the buccal surface of the lip.  Neck:     Musculoskeletal: Neck supple.  Cardiovascular:     Rate and Rhythm: Normal rate.  Pulmonary:     Effort: Pulmonary effort is normal.  Skin:    General: Skin is warm.  Neurological:     Mental Status: He is alert and oriented to person, place, and time.      ED Treatments / Results  Labs (all labs ordered are listed, but only abnormal results are displayed) Labs Reviewed  COMPREHENSIVE METABOLIC PANEL - Abnormal; Notable for the following components:      Result Value   Chloride 97 (*)    Glucose, Bld 351 (*)    AST 10 (*)    All other components within normal limits  CBC WITH DIFFERENTIAL/PLATELET - Abnormal; Notable for the following components:   WBC 11.7 (*)    RBC 6.02 (*)    MCV 77.7 (*)    MCH 25.1 (*)    RDW 16.9 (*)    Neutro Abs 8.7 (*)    All other components within normal limits  AEROBIC CULTURE (SUPERFICIAL SPECIMEN)    EKG None  Radiology Ct Maxillofacial W Contrast  Result Date: 11/14/2018 CLINICAL DATA:  Lower lobe swelling for 4 days EXAM: CT MAXILLOFACIAL WITH CONTRAST TECHNIQUE: Multidetector CT imaging of the maxillofacial structures was performed with intravenous contrast. Multiplanar CT image reconstructions were also generated. CONTRAST:  35mL OMNIPAQUE IOHEXOL 300 MG/ML  SOLN COMPARISON:  Head CT 05/13/2017 FINDINGS: Osseous: No facial fracture. Dental: There is a periapical lucency at the root of tooth 24, the left mandibular central incisor. This is in continuity with an overlying fluid collection of the lower lip that measures 13 x 13 mm. There is mild surrounding inflammation. Orbits: The globes are intact. Normal appearance of the intra- and extraconal fat. Symmetric extraocular muscles. Sinuses: No fluid  levels or advanced mucosal thickening. Soft tissues: Normal visualized extracranial soft tissues. Limited intracranial: Normal. IMPRESSION: Periapical lucency at the root of tooth 24 is the likely source of small abscess within the midline lower lip. Electronically Signed   By: Deatra Robinson M.D.   On: 11/14/2018 22:37    Procedures .Nerve Block Date/Time: 11/14/2018 11:36 PM Performed by: Elson Areas, PA-C Authorized by: Derwood Kaplan, MD   Consent:    Consent obtained:  Verbal   Consent given by:  Patient   Risks discussed:  Infection, swelling, bleeding, pain and nerve damage   Alternatives discussed:  No treatment Indications:    Indications:  Procedural anesthesia Location:    Body area:  Head   Head nerve blocked: mental nerve.   Laterality:  Left Pre-procedure details:    Skin preparation:  Alcohol   Preparation: Patient was prepped and draped in usual sterile fashion   Procedure details (see MAR for exact dosages):    Block needle gauge:  27 G   Anesthetic injected:  Lidocaine 1% WITH epi   Injection procedure:  Anatomic landmarks identified Post-procedure details:    Outcome:  Anesthesia achieved   Patient tolerance of procedure:  Tolerated well, no immediate complications .Marland KitchenIncision and Drainage Date/Time: 11/14/2018 11:40 PM Performed by: Elson Areas, PA-C Authorized by: Derwood Kaplan, MD   Consent:    Consent obtained:  Verbal   Consent given by:  Patient   Risks discussed:  Bleeding, pain, incomplete drainage and infection   Alternatives discussed:  No treatment Location:    Type:  Abscess   Size:  2 x 2 cm   Location:  Mouth   Mouth location: Lip. Procedure type:    Complexity:  Complex Procedure details:    Incision types:  Stab incision   Incision depth:  Submucosal   Scalpel blade:  10   Wound management:  Probed and deloculated, extensive cleaning and irrigated with saline   Drainage:  Purulent and serosanguinous   Drainage amount:   Moderate   Wound treatment:  Wound left open Post-procedure details:    Patient tolerance of procedure:  Tolerated well, no immediate complications   (including critical care time)  Medications Ordered in ED Medications  fentaNYL (SUBLIMAZE) injection 100 mcg (100 mcg Intravenous Given 11/14/18 2051)  iohexol (OMNIPAQUE) 300 MG/ML solution 75 mL (75 mLs Intravenous Contrast Given 11/14/18 2205)  ketorolac (TORADOL) 15 MG/ML injection 15 mg (15 mg Intravenous Given 11/14/18 2142)  clindamycin (CLEOCIN) capsule 600 mg (600 mg Oral Given 11/14/18 2251)  bupivacaine (MARCAINE) 0.5 % injection 10 mL (10 mLs Infiltration Given by Other 11/14/18 2326)  povidone-iodine (BETADINE) 10 % external solution (2 application  Given by Other 11/14/18 2326)  oxyCODONE-acetaminophen (PERCOCET/ROXICET) 5-325 MG per tablet 1 tablet (1 tablet Oral Given 11/15/18 0026)  amLODipine (NORVASC) tablet 10 mg (10 mg Oral Given 11/15/18 0039)     Initial Impression / Assessment and Plan / ED Course  I have reviewed the triage vital signs and the nursing notes.  Pertinent labs & imaging results that were available during my care of the patient were reviewed by me and considered in my medical decision making (see chart for details).        38 year old comes in a chief complaint of worsening lip swelling.  CT scan ordered to evaluate the suspected abscess of the lip, and to ensure that it is not tracking into the rest of the face and to figure out the origin of the abscess.  CT scan results show that patient has a lip abscess with ordination at tooth #24 where there is some periapical lucency.  He will get oral clindamycin.  He has been advised to stop taking prednisone.  There is no red flags suggesting any life-threatening internal swelling in the CAT scan rules out deep space infection beyond the localized abscess.  I&D along with nerve block will be performed by the APP.  Patient has been advised to stop taking  prednisone.  Strict ER return precautions discussed with him.  He also has been advised to follow-up with dentistry for the extraction of the tooth.  Final Clinical Impressions(s) /  ED Diagnoses   Final diagnoses:  Dental abscess  Abscess of lip    ED Discharge Orders         Ordered    clindamycin (CLEOCIN) 300 MG capsule  3 times daily     11/14/18 2317    acetaminophen (TYLENOL 8 HOUR) 650 MG CR tablet  Every 8 hours     11/14/18 2317           Derwood KaplanNanavati, Gene Colee, MD 11/16/18 1542

## 2018-11-17 LAB — AEROBIC CULTURE? (SUPERFICIAL SPECIMEN): Gram Stain: NONE SEEN

## 2018-11-18 ENCOUNTER — Telehealth: Payer: Self-pay | Admitting: Emergency Medicine

## 2018-11-18 NOTE — Telephone Encounter (Signed)
Post ED Visit - Positive Culture Follow-up  Culture report reviewed by antimicrobial stewardship pharmacist: [facility] []  Enzo Bi, Pharm.D. []  Celedonio Miyamoto, Pharm.D., BCPS AQ-ID []  Garvin Fila, Pharm.D., BCPS []  Georgina Pillion, Pharm.D., BCPS []  [place], [place].D., BCPS, AAHIVP []  Estella Husk, Pharm.D., BCPS, AAHIVP [x]  Lysle Pearl, PharmD, BCPS []  Phillips Climes, PharmD, BCPS []  Agapito Games, PharmD, BCPS []  Verlan Friends, PharmD []  Mervyn Gay, PharmD, BCPS []  Vinnie Level, PharmD  [facility] []  Len Childs, PharmD []  Greer Pickerel, PharmD []  Adalberto Cole, PharmD []  Perlie Gold, Rph []  Lonell Face) Jean Rosenthal, PharmD []  Earl Many, PharmD []  Junita Push, PharmD []  Dorna Leitz, PharmD []  Terrilee Files, PharmD []  Lynann Beaver, PharmD []  Keturah Barre, PharmD []  Loralee Pacas, PharmD []  Bernadene Person, PharmD   Positive wound culture Treated with clindamycin, organism sensitive to the same and no further patient follow-up is required at this time.  Berle Mull 11/18/2018, 9:58 AM

## 2021-01-28 IMAGING — CT CT MAXILLOFACIAL WITH CONTRAST
2 series · 10 of 14 positions shown, 12 images · IV contrast (omnipaque)
Comparison: Head CT 05/13/2017

CLINICAL DATA: Lower lobe swelling for 4 days

EXAM:
CT MAXILLOFACIAL WITH CONTRAST
TECHNIQUE: Multidetector CT imaging of the maxillofacial structures was
performed with intravenous contrast. Multiplanar CT image
reconstructions were also generated.
CONTRAST:  75mL OMNIPAQUE IOHEXOL 300 MG/ML  SOLN

[Series 2: axial neck · axial · 0.53mm/px · z∈[-167,-35]mm · 5 of 100 slices shown, 7 images (1 of 2)]
[im 17/100  soft-tissue]
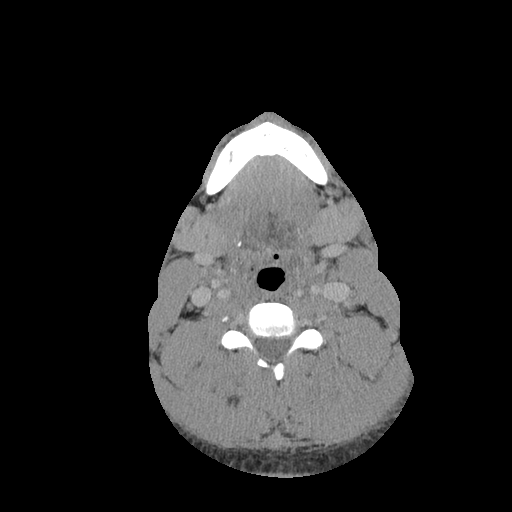
[im 17/100  bone]
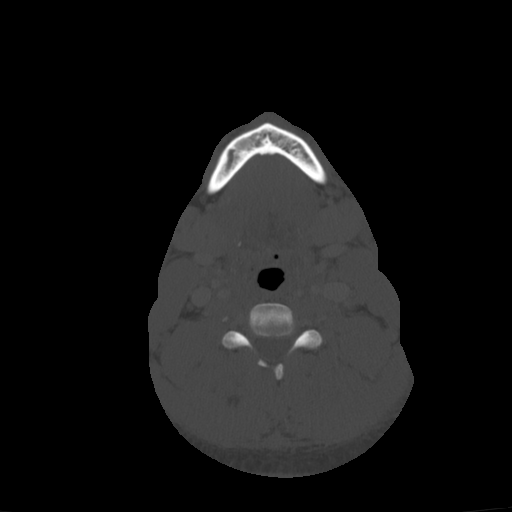
[im 34/100  bone]
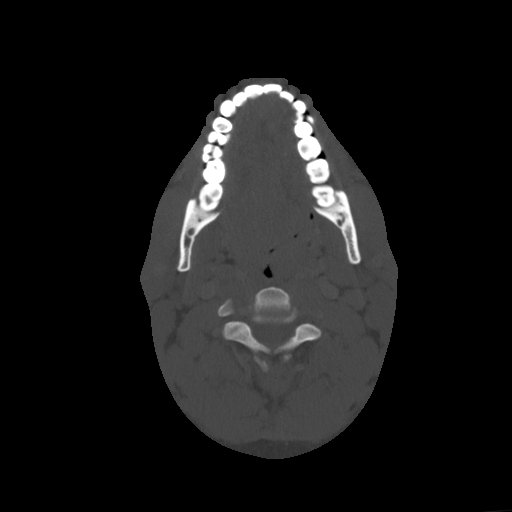
[im 50/100  bone]
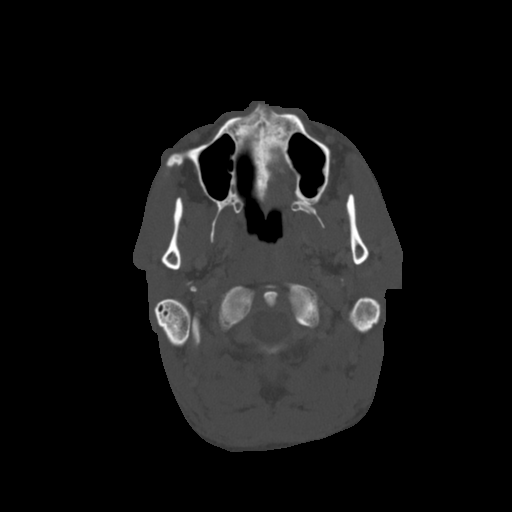
[im 67/100  bone]
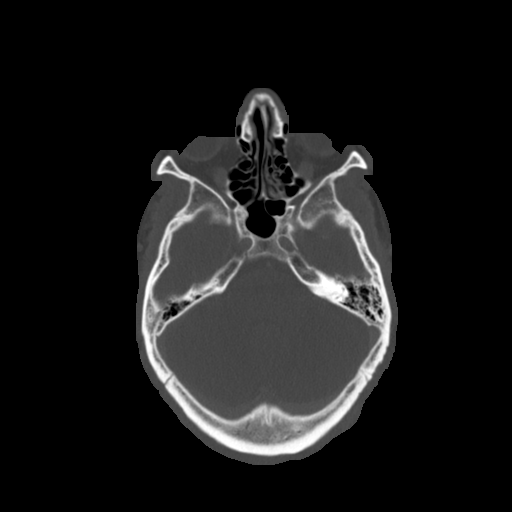
[im 83/100  soft-tissue]
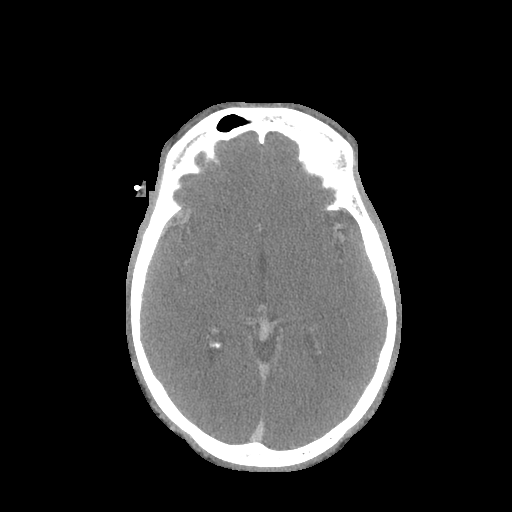
[im 83/100  bone]
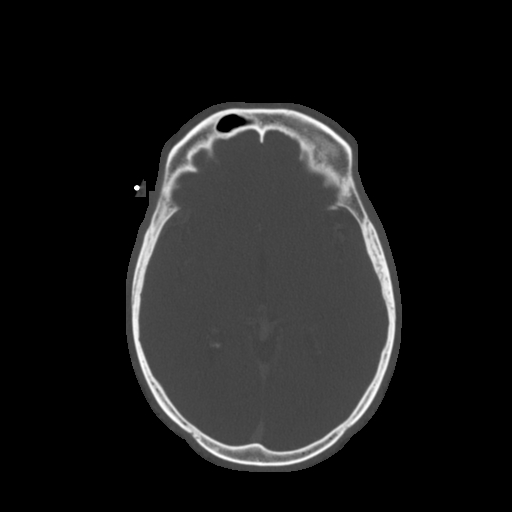

[Series 4: axial neck · axial · 0.46mm/px · z∈[-177,-38]mm · 5 of 106 slices shown (2 of 2)]
[im 18/106  bone]
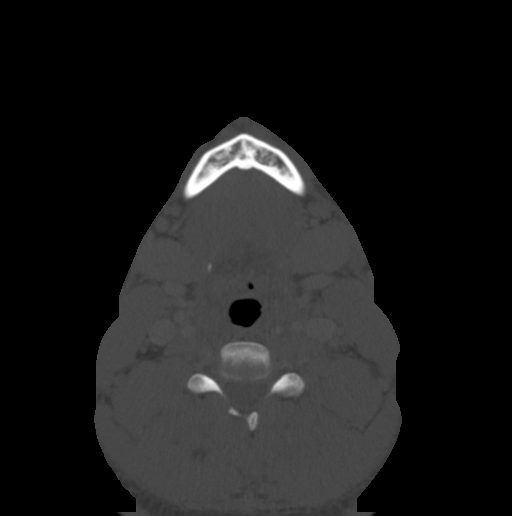
[im 36/106  bone]
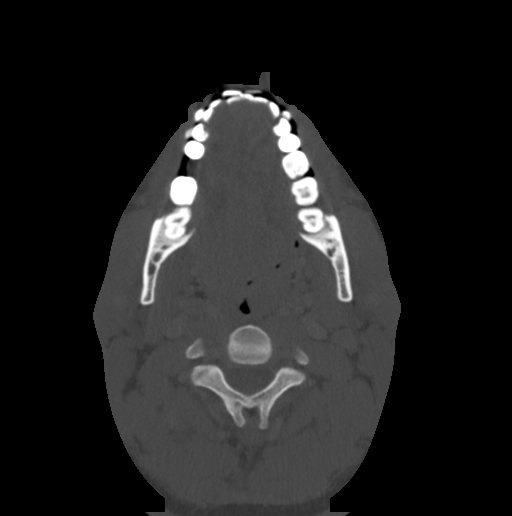
[im 53/106  bone]
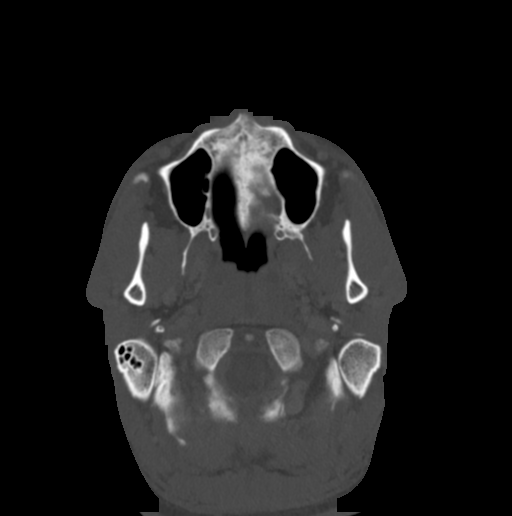
[im 71/106  bone]
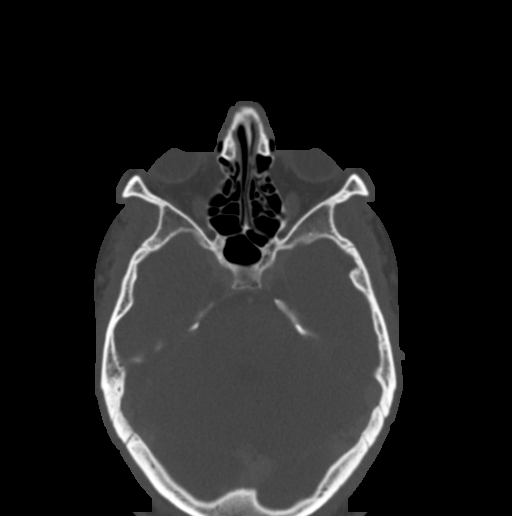
[im 88/106  bone]
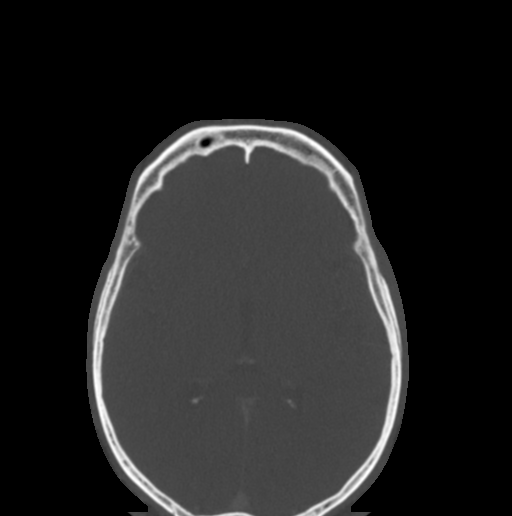

[10 of 14 positions shown; findings below may reference images not displayed]

FINDINGS: Osseous: No facial fracture.

Dental: There is a periapical lucency at the root of tooth 24, the
left mandibular central incisor. This is in continuity with an
overlying fluid collection of the lower lip that measures 13 x 13
mm. There is mild surrounding inflammation.

Orbits: The globes are intact. Normal appearance of the intra- and
extraconal fat. Symmetric extraocular muscles.

Sinuses: No fluid levels or advanced mucosal thickening.

Soft tissues: Normal visualized extracranial soft tissues.

Limited intracranial: Normal.
IMPRESSION: Periapical lucency at the root of tooth 24 is the likely source of
small abscess within the midline lower lip.
